# Patient Record
Sex: Female | Born: 1985 | Race: Black or African American | Hispanic: No | Marital: Single | Smoking: Never smoker
Health system: Southern US, Community
[De-identification: ages and names within clinical notes are randomized; demographics above are authoritative.]

## PROBLEM LIST (undated history)

## (undated) DIAGNOSIS — K429 Umbilical hernia without obstruction or gangrene: Secondary | ICD-10-CM

## (undated) DIAGNOSIS — E278 Other specified disorders of adrenal gland: Secondary | ICD-10-CM

## (undated) DIAGNOSIS — E079 Disorder of thyroid, unspecified: Secondary | ICD-10-CM

## (undated) DIAGNOSIS — E279 Disorder of adrenal gland, unspecified: Secondary | ICD-10-CM

## (undated) DIAGNOSIS — D497 Neoplasm of unspecified behavior of endocrine glands and other parts of nervous system: Secondary | ICD-10-CM

## (undated) DIAGNOSIS — C73 Malignant neoplasm of thyroid gland: Secondary | ICD-10-CM

## (undated) DIAGNOSIS — E0500 Thyrotoxicosis with diffuse goiter without thyrotoxic crisis or storm: Secondary | ICD-10-CM

## (undated) DIAGNOSIS — D649 Anemia, unspecified: Secondary | ICD-10-CM

## (undated) DIAGNOSIS — E039 Hypothyroidism, unspecified: Secondary | ICD-10-CM

## (undated) DIAGNOSIS — C801 Malignant (primary) neoplasm, unspecified: Secondary | ICD-10-CM

## (undated) HISTORY — DX: Neoplasm of unspecified behavior of endocrine glands and other parts of nervous system: D49.7

## (undated) HISTORY — DX: Disorder of thyroid, unspecified: E07.9

## (undated) HISTORY — DX: Malignant (primary) neoplasm, unspecified: C80.1

## (undated) HISTORY — PX: OTHER SURGICAL HISTORY: SHX169

## (undated) HISTORY — DX: Malignant neoplasm of thyroid gland: C73

## (undated) HISTORY — DX: Thyrotoxicosis with diffuse goiter without thyrotoxic crisis or storm: E05.00

## (undated) HISTORY — DX: Anemia, unspecified: D64.9

## (undated) MED FILL — Iron Sucrose Inj 20 MG/ML (Fe Equiv): INTRAVENOUS | Qty: 10 | Status: AC

---
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
## 2007-01-21 DIAGNOSIS — E079 Disorder of thyroid, unspecified: Secondary | ICD-10-CM | POA: Insufficient documentation

## 2007-01-21 HISTORY — DX: Disorder of thyroid, unspecified: E07.9

## 2010-02-16 ENCOUNTER — Emergency Department: Payer: Self-pay | Admitting: Emergency Medicine

## 2010-03-18 ENCOUNTER — Emergency Department: Payer: Self-pay | Admitting: Emergency Medicine

## 2011-03-06 DIAGNOSIS — D509 Iron deficiency anemia, unspecified: Secondary | ICD-10-CM | POA: Insufficient documentation

## 2011-05-07 DIAGNOSIS — O1490 Unspecified pre-eclampsia, unspecified trimester: Secondary | ICD-10-CM

## 2011-07-08 DIAGNOSIS — Z9889 Other specified postprocedural states: Secondary | ICD-10-CM | POA: Insufficient documentation

## 2011-07-08 DIAGNOSIS — O34219 Maternal care for unspecified type scar from previous cesarean delivery: Secondary | ICD-10-CM | POA: Insufficient documentation

## 2011-07-09 DIAGNOSIS — D62 Acute posthemorrhagic anemia: Secondary | ICD-10-CM | POA: Insufficient documentation

## 2011-07-17 ENCOUNTER — Emergency Department: Payer: Self-pay | Admitting: Emergency Medicine

## 2011-07-17 LAB — COMPREHENSIVE METABOLIC PANEL
Albumin: 2.9 g/dL — ABNORMAL LOW (ref 3.4–5.0)
Anion Gap: 10 (ref 7–16)
Bilirubin,Total: 0.3 mg/dL (ref 0.2–1.0)
Calcium, Total: 9.3 mg/dL (ref 8.5–10.1)
Chloride: 106 mmol/L (ref 98–107)
Co2: 21 mmol/L (ref 21–32)
EGFR (Non-African Amer.): >60
Glucose: 85 mg/dL (ref 65–99)
Osmolality: 273 (ref 275–301)
Potassium: 4.1 mmol/L (ref 3.5–5.1)

## 2011-07-17 LAB — URINALYSIS, COMPLETE
Bacteria: NONE SEEN
Bilirubin,UR: NEGATIVE
Ketone: NEGATIVE
Nitrite: NEGATIVE
Ph: 7 (ref 4.5–8.0)
Protein: 100
Specific Gravity: 1.017 (ref 1.003–1.030)
WBC UR: 18 /HPF (ref 0–5)

## 2011-07-17 LAB — PRO B NATRIURETIC PEPTIDE: B-Type Natriuretic Peptide: 42 pg/mL (ref 0–125)

## 2011-07-17 LAB — CBC
MCH: 25 pg — ABNORMAL LOW (ref 26.0–34.0)
Platelet: 576 10*3/uL — ABNORMAL HIGH (ref 150–440)
RBC: 3.58 10*6/uL — ABNORMAL LOW (ref 3.80–5.20)
WBC: 9.3 10*3/uL (ref 3.6–11.0)

## 2011-07-17 LAB — TSH: Thyroid Stimulating Horm: 66.6 u[IU]/mL — ABNORMAL HIGH

## 2011-07-17 LAB — T4, FREE: Free Thyroxine: 0.62 ng/dL — ABNORMAL LOW (ref 0.76–1.46)

## 2020-07-12 ENCOUNTER — Encounter: Payer: Self-pay | Admitting: Family Medicine

## 2020-07-12 ENCOUNTER — Other Ambulatory Visit: Payer: Self-pay

## 2020-07-12 ENCOUNTER — Ambulatory Visit (INDEPENDENT_AMBULATORY_CARE_PROVIDER_SITE_OTHER): Payer: Medicare Other | Admitting: Family Medicine

## 2020-07-12 VITALS — BP 132/90 | HR 80 | Ht 64.0 in | Wt 198.0 lb

## 2020-07-12 DIAGNOSIS — Z7689 Persons encountering health services in other specified circumstances: Secondary | ICD-10-CM

## 2020-07-12 DIAGNOSIS — K439 Ventral hernia without obstruction or gangrene: Secondary | ICD-10-CM

## 2020-07-12 DIAGNOSIS — E039 Hypothyroidism, unspecified: Secondary | ICD-10-CM | POA: Diagnosis not present

## 2020-07-12 DIAGNOSIS — Z862 Personal history of diseases of the blood and blood-forming organs and certain disorders involving the immune mechanism: Secondary | ICD-10-CM

## 2020-07-12 DIAGNOSIS — Z01419 Encounter for gynecological examination (general) (routine) without abnormal findings: Secondary | ICD-10-CM

## 2020-07-12 DIAGNOSIS — Z8639 Personal history of other endocrine, nutritional and metabolic disease: Secondary | ICD-10-CM | POA: Diagnosis not present

## 2020-07-12 DIAGNOSIS — D649 Anemia, unspecified: Secondary | ICD-10-CM | POA: Diagnosis not present

## 2020-07-12 NOTE — Progress Notes (Signed)
Date:  07/12/2020   Name:  Desiree Gaines   DOB:  03/06/1985   MRN:  [id number]   Chief Complaint: Establish Care Elnoria Howard Yolonda Kida is in room with pt to help with visit- pt has gave verification for her to be in room)  Patient is a 35 year old female who presents for a establish care exam. The patient reports the following problems: ventral/umbillical hernia. Health maintenance has been reviewed gyn care.   Lab Results  Component Value Date   CREATININE 0.61 07/17/2011   BUN 13 07/17/2011   NA 137 07/17/2011   K 4.1 07/17/2011   CL 106 07/17/2011   CO2 21 07/17/2011   No results found for: CHOL, HDL, LDLCALC, LDLDIRECT, TRIG, CHOLHDL Lab Results  Component Value Date   TSH 66.6 (H) 07/17/2011   No results found for: HGBA1C Lab Results  Component Value Date   WBC 9.3 07/17/2011   HGB 9.0 (L) 07/17/2011   HCT 29.8 (L) 07/17/2011   MCV 83 07/17/2011   PLT 576 (H) 07/17/2011   Lab Results  Component Value Date   ALT 13 07/17/2011   AST 15 07/17/2011   ALKPHOS 167 (H) 07/17/2011   BILITOT 0.3 07/17/2011     Review of Systems  Constitutional: Negative for chills and fever.  HENT: Negative for drooling, ear discharge, ear pain and sore throat.   Respiratory: Negative for cough, shortness of breath and wheezing.   Cardiovascular: Negative for chest pain, palpitations and leg swelling.  Gastrointestinal: Negative for abdominal pain, blood in stool, constipation, diarrhea and nausea.  Endocrine: Negative for polydipsia.  Genitourinary: Negative for dysuria, frequency, hematuria and urgency.  Musculoskeletal: Negative for back pain, myalgias and neck pain.  Skin: Negative for rash.  Allergic/Immunologic: Negative for environmental allergies.  Neurological: Negative for dizziness and headaches.  Hematological: Does not bruise/bleed easily.  Psychiatric/Behavioral: Negative for suicidal ideas. The patient is not nervous/anxious.     There are no problems to display  for this patient.   No Known Allergies  Past Surgical History:  Procedure Laterality Date  . CESAREAN SECTION     x 2  . tubes in ears Bilateral     Social History   Tobacco Use  . Smoking status: Never Smoker  . Smokeless tobacco: Never Used  Substance Use Topics  . Alcohol use: Not Currently  . Drug use: Never     Medication list has been reviewed and updated.  No outpatient medications have been marked as taking for the 07/12/20 encounter (Office Visit) with Juline Patch, MD.    [facility] 2/9 Scores 07/12/2020  PHQ - 2 Score 4  PHQ- 9 Score 12    GAD 7 : Generalized Anxiety Score 07/12/2020  Nervous, Anxious, on Edge 3  Control/stop worrying 2  Worry too much - different things 2  Trouble relaxing 1  Restless 1  Easily annoyed or irritable 1  Afraid - awful might happen 1  Total GAD 7 Score 11  Anxiety Difficulty Not difficult at all    BP Readings from Last 3 Encounters:  07/12/20 132/90    Physical Exam Vitals and nursing note reviewed.  Constitutional:      Appearance: She is well-developed.  HENT:     Head: Normocephalic.     Right Ear: External ear normal.     Left Ear: External ear normal.     Nose: Nose normal. No congestion or rhinorrhea.  Eyes:     General: Lids are  everted, no foreign bodies appreciated. No scleral icterus.       Left eye: No foreign body or hordeolum.     Conjunctiva/sclera: Conjunctivae normal.     Right eye: Right conjunctiva is not injected.     Left eye: Left conjunctiva is not injected.     Pupils: Pupils are equal, round, and reactive to light.  Neck:     Thyroid: No thyromegaly.     Vascular: No JVD.     Trachea: No tracheal deviation.  Cardiovascular:     Rate and Rhythm: Normal rate and regular rhythm.     Heart sounds: Normal heart sounds. No murmur heard. No friction rub. No gallop.   Pulmonary:     Effort: Pulmonary effort is normal. No respiratory distress.     Breath sounds: Normal breath sounds. No  wheezing, rhonchi or rales.  Abdominal:     General: Bowel sounds are normal.     Palpations: Abdomen is soft. There is no mass.     Tenderness: There is no abdominal tenderness. There is no guarding or rebound.     Hernia: A hernia is present. Hernia is present in the umbilical area and ventral area.  Musculoskeletal:        General: No tenderness. Normal range of motion.     Cervical back: Normal range of motion and neck supple.  Lymphadenopathy:     Cervical: No cervical adenopathy.  Skin:    General: Skin is warm.     Findings: No rash.  Neurological:     Mental Status: She is alert and oriented to person, place, and time.     Cranial Nerves: No cranial nerve deficit.     Deep Tendon Reflexes: Reflexes normal.  Psychiatric:        Mood and Affect: Mood is not anxious or depressed.     Wt Readings from Last 3 Encounters:  07/12/20 198 lb (89.8 kg)    BP 132/90   Pulse 80   Ht 5\' 4"  (1.626 m)   Wt 198 lb (89.8 kg)   LMP 06/20/2020 Comment: irregular  BMI 33.99 kg/m   Assessment and Plan:  1. Establishing care with new doctor, encounter for Patient establishing care with new physician.  We are picking from 2015 when she was evaluated with a ventral hernia and which time she did not go back for lab work and preop evaluation and ultimately labs and CT scans to take a look at the abdominal structures prior to surgery. - Comprehensive metabolic panel  2. Encounter for routine gynecologic examination in Medicare patient Initially [place] start with referral to GYN for Pap and pelvic and evaluation of uterus adnexa and ovaries prior to surgery. - Comprehensive metabolic panel - Ambulatory referral to Gynecology  3. Ventral hernia without obstruction or gangrene Significant ventral hernia which has probably increased but has no gangrene or obstruction.  It is reducible but there is some discomfort when reduced reducing the umbilical portion of it.  4. History of Graves'  disease Upon review of sepsis numbers with patient patient had a history of Graves' disease for which on 2 occasions she had to consume iodine with radioactivity and there was destruction of her thyroid.  Patient had acquired hypothyroid and was in the process of being evaluated with endocrine when patient did not return for further evaluation we will obtain a thyroid panel with TSH to determine her level of hypothyroidism. - Thyroid Panel With TSH  5. Acquired hypothyroidism This is  acquired by above and we will check thyroid panel and treat accordingly with levothyroxine. - Thyroid Panel With TSH  6. History of anemia Was noted on CBC the patient had anemia and we are rechecking a CBC along with CMP to see if there is indeed continued anemia and will correct this accordingly. - CBC with Differential/Platelet - Comprehensive metabolic panel  If all goes as planned we will be contacting initially we thought general surgery at [facility] but I we understand there is a hernia clinic and that patient will need to go in this direction given the significance of the ventral hernia.  We will try to clear up things from of the medical standpoint and refer to hernia clinic for evaluation with CT scan and further determination of corrective measures.  Patient has a adult accompanying her that used to give her help and to make sure she is able to to follow through with this direction her name is Karn Pickler and phone number is been placed when they call her for GYN appointment upcoming.

## 2020-07-13 ENCOUNTER — Other Ambulatory Visit: Payer: Self-pay

## 2020-07-13 DIAGNOSIS — E039 Hypothyroidism, unspecified: Secondary | ICD-10-CM

## 2020-07-13 LAB — CBC WITH DIFFERENTIAL/PLATELET
Basophils Absolute: 0 10*3/uL (ref 0.0–0.2)
Basos: 1 %
EOS (ABSOLUTE): 0 10*3/uL (ref 0.0–0.4)
Eos: 1 %
Hematocrit: 30.1 % — ABNORMAL LOW (ref 34.0–46.6)
Hemoglobin: 9.6 g/dL — ABNORMAL LOW (ref 11.1–15.9)
Immature Grans (Abs): 0.1 10*3/uL (ref 0.0–0.1)
Immature Granulocytes: 2 %
Lymphocytes Absolute: 1.9 10*3/uL (ref 0.7–3.1)
Lymphs: 31 %
MCH: 31.8 pg (ref 26.6–33.0)
MCHC: 31.9 g/dL (ref 31.5–35.7)
MCV: 100 fL — ABNORMAL HIGH (ref 79–97)
Monocytes Absolute: 0.4 10*3/uL (ref 0.1–0.9)
Monocytes: 7 %
Neutrophils Absolute: 3.6 10*3/uL (ref 1.4–7.0)
Neutrophils: 58 %
Platelets: 242 10*3/uL (ref 150–450)
RBC: 3.02 x10E6/uL — ABNORMAL LOW (ref 3.77–5.28)
RDW: 13.1 % (ref 11.7–15.4)
WBC: 6.1 10*3/uL (ref 3.4–10.8)

## 2020-07-13 LAB — COMPREHENSIVE METABOLIC PANEL
ALT: 20 IU/L (ref 0–32)
AST: 34 IU/L (ref 0–40)
Albumin/Globulin Ratio: 1.4 (ref 1.2–2.2)
Albumin: 4.6 g/dL (ref 3.8–4.8)
Alkaline Phosphatase: 101 IU/L (ref 44–121)
BUN/Creatinine Ratio: 13 (ref 9–23)
BUN: 13 mg/dL (ref 6–20)
Bilirubin Total: 0.4 mg/dL (ref 0.0–1.2)
CO2: 22 mmol/L (ref 20–29)
Calcium: 10.6 mg/dL — ABNORMAL HIGH (ref 8.7–10.2)
Chloride: 102 mmol/L (ref 96–106)
Creatinine, Ser: 1 mg/dL (ref 0.57–1.00)
Globulin, Total: 3.2 g/dL (ref 1.5–4.5)
Glucose: 90 mg/dL (ref 65–99)
Potassium: 4.9 mmol/L (ref 3.5–5.2)
Sodium: 136 mmol/L (ref 134–144)
Total Protein: 7.8 g/dL (ref 6.0–8.5)
eGFR: 76 mL/min/{1.73_m2} (ref >59)

## 2020-07-13 LAB — THYROID PANEL WITH TSH
Free Thyroxine Index: 0 — ABNORMAL LOW (ref 1.2–4.9)
T3 Uptake Ratio: 9 % — ABNORMAL LOW (ref 24–39)
T4, Total: 0.5 ug/dL — CL (ref 4.5–12.0)
TSH: 66.4 u[IU]/mL — ABNORMAL HIGH (ref 0.450–4.500)

## 2020-07-13 MED ORDER — LEVOTHYROXINE SODIUM 50 MCG PO TABS: 50.0000 ug | ORAL_TABLET | Freq: Every day | ORAL | 1 refills | Status: DC

## 2020-07-13 NOTE — Progress Notes (Signed)
Sent in levo 30mcg

## 2020-07-16 ENCOUNTER — Telehealth: Payer: Self-pay | Admitting: Family Medicine

## 2020-07-16 ENCOUNTER — Telehealth: Payer: Self-pay

## 2020-07-16 ENCOUNTER — Other Ambulatory Visit: Payer: Self-pay

## 2020-07-16 DIAGNOSIS — K439 Ventral hernia without obstruction or gangrene: Secondary | ICD-10-CM

## 2020-07-16 LAB — FERRITIN: Ferritin: 29 ng/mL (ref 15–150)

## 2020-07-16 LAB — SPECIMEN STATUS REPORT

## 2020-07-16 LAB — B12 AND FOLATE PANEL
Folate: 3.6 ng/mL (ref >3.0)
Vitamin B-12: 355 pg/mL (ref 232–1245)

## 2020-07-16 NOTE — Telephone Encounter (Signed)
Called to request for information for referral that was received for patient.  The referral was for CT of abdomen and pelvis w/contrast.  Stated that a prior auth. Is needed to be approved.  Please call [phone] to discuss.  And fax# [phone]

## 2020-07-16 NOTE — Telephone Encounter (Signed)
Called Lauren and we will forward referral to [facility] Surg center and go from there

## 2020-07-16 NOTE — Progress Notes (Signed)
Faxed order to [facility] for CT of Abd and pelvis without contrast per hernia clinic to [phone]

## 2020-07-19 ENCOUNTER — Encounter: Payer: Medicare Other | Admitting: Family Medicine

## 2020-07-27 ENCOUNTER — Ambulatory Visit: Payer: Medicare Other | Admitting: Surgery

## 2020-07-27 ENCOUNTER — Ambulatory Visit: Payer: Medicare Other | Admitting: Family Medicine

## 2020-07-28 ENCOUNTER — Ambulatory Visit: Payer: Medicare Other | Admitting: Family Medicine

## 2020-07-28 ENCOUNTER — Telehealth: Payer: Self-pay

## 2020-07-28 ENCOUNTER — Encounter: Payer: Self-pay | Admitting: Obstetrics and Gynecology

## 2020-07-28 NOTE — Telephone Encounter (Signed)
[place] [facility] referring for routine care. Called and left voicemail for patient to call back to be scheduled.

## 2020-07-29 NOTE — Telephone Encounter (Signed)
Voicemail box is full - unable to leave message

## 2020-07-30 NOTE — Telephone Encounter (Signed)
Voicemail box is full - unable to leave message

## 2020-08-02 ENCOUNTER — Emergency Department: Payer: Medicare Other

## 2020-08-02 ENCOUNTER — Inpatient Hospital Stay
Admission: EM | Admit: 2020-08-02 | Discharge: 2020-08-07 | DRG: 158 | Disposition: A | Discharge status: Home / Self Care | Payer: Medicare Other | Attending: Family Medicine | Admitting: Family Medicine

## 2020-08-02 ENCOUNTER — Other Ambulatory Visit: Payer: Self-pay

## 2020-08-02 ENCOUNTER — Encounter: Payer: Self-pay | Admitting: Emergency Medicine

## 2020-08-02 DIAGNOSIS — M609 Myositis, unspecified: Secondary | ICD-10-CM | POA: Diagnosis not present

## 2020-08-02 DIAGNOSIS — Z79890 Hormone replacement therapy (postmenopausal): Secondary | ICD-10-CM

## 2020-08-02 DIAGNOSIS — K1120 Sialoadenitis, unspecified: Secondary | ICD-10-CM | POA: Diagnosis not present

## 2020-08-02 DIAGNOSIS — Z20822 Contact with and (suspected) exposure to covid-19: Secondary | ICD-10-CM | POA: Diagnosis not present

## 2020-08-02 DIAGNOSIS — E669 Obesity, unspecified: Secondary | ICD-10-CM | POA: Diagnosis present

## 2020-08-02 DIAGNOSIS — E538 Deficiency of other specified B group vitamins: Secondary | ICD-10-CM | POA: Diagnosis not present

## 2020-08-02 DIAGNOSIS — T8130XA Disruption of wound, unspecified, initial encounter: Secondary | ICD-10-CM | POA: Diagnosis not present

## 2020-08-02 DIAGNOSIS — R6889 Other general symptoms and signs: Secondary | ICD-10-CM | POA: Diagnosis not present

## 2020-08-02 DIAGNOSIS — E039 Hypothyroidism, unspecified: Secondary | ICD-10-CM | POA: Diagnosis present

## 2020-08-02 DIAGNOSIS — E0500 Thyrotoxicosis with diffuse goiter without thyrotoxic crisis or storm: Secondary | ICD-10-CM | POA: Diagnosis not present

## 2020-08-02 DIAGNOSIS — L03211 Cellulitis of face: Secondary | ICD-10-CM | POA: Diagnosis present

## 2020-08-02 DIAGNOSIS — R609 Edema, unspecified: Secondary | ICD-10-CM

## 2020-08-02 DIAGNOSIS — Z6833 Body mass index (BMI) 33.0-33.9, adult: Secondary | ICD-10-CM | POA: Diagnosis not present

## 2020-08-02 DIAGNOSIS — R600 Localized edema: Secondary | ICD-10-CM | POA: Diagnosis not present

## 2020-08-02 DIAGNOSIS — R52 Pain, unspecified: Secondary | ICD-10-CM | POA: Diagnosis not present

## 2020-08-02 DIAGNOSIS — D649 Anemia, unspecified: Secondary | ICD-10-CM | POA: Diagnosis present

## 2020-08-02 DIAGNOSIS — L03221 Cellulitis of neck: Secondary | ICD-10-CM | POA: Diagnosis not present

## 2020-08-02 DIAGNOSIS — K047 Periapical abscess without sinus: Principal | ICD-10-CM | POA: Diagnosis present

## 2020-08-02 DIAGNOSIS — K429 Umbilical hernia without obstruction or gangrene: Secondary | ICD-10-CM | POA: Diagnosis not present

## 2020-08-02 DIAGNOSIS — J392 Other diseases of pharynx: Secondary | ICD-10-CM | POA: Diagnosis not present

## 2020-08-02 DIAGNOSIS — Z743 Need for continuous supervision: Secondary | ICD-10-CM | POA: Diagnosis not present

## 2020-08-02 DIAGNOSIS — Z8719 Personal history of other diseases of the digestive system: Secondary | ICD-10-CM | POA: Diagnosis not present

## 2020-08-02 HISTORY — DX: Umbilical hernia without obstruction or gangrene: K42.9

## 2020-08-02 LAB — BASIC METABOLIC PANEL
Anion gap: 5 (ref 5–15)
BUN: 8 mg/dL (ref 6–20)
CO2: 28 mmol/L (ref 22–32)
Calcium: 9.8 mg/dL (ref 8.9–10.3)
Chloride: 99 mmol/L (ref 98–111)
Creatinine, Ser: 0.76 mg/dL (ref 0.44–1.00)
GFR, Estimated: >60 mL/min (ref >60)
Glucose, Bld: 101 mg/dL — ABNORMAL HIGH (ref 70–99)
Potassium: 3.8 mmol/L (ref 3.5–5.1)
Sodium: 132 mmol/L — ABNORMAL LOW (ref 135–145)

## 2020-08-02 LAB — CBC WITH DIFFERENTIAL/PLATELET
Abs Immature Granulocytes: 0.88 10*3/uL — ABNORMAL HIGH (ref 0.00–0.07)
Basophils Absolute: 0.1 10*3/uL (ref 0.0–0.1)
Basophils Relative: 0 %
Eosinophils Absolute: 0 10*3/uL (ref 0.0–0.5)
Eosinophils Relative: 0 %
HCT: 27.6 % — ABNORMAL LOW (ref 36.0–46.0)
Hemoglobin: 8.7 g/dL — ABNORMAL LOW (ref 12.0–15.0)
Immature Granulocytes: 5 %
Lymphocytes Relative: 4 %
Lymphs Abs: 0.7 10*3/uL (ref 0.7–4.0)
MCH: 31.1 pg (ref 26.0–34.0)
MCHC: 31.5 g/dL (ref 30.0–36.0)
MCV: 98.6 fL (ref 80.0–100.0)
Monocytes Absolute: 0.6 10*3/uL (ref 0.1–1.0)
Monocytes Relative: 3 %
Neutro Abs: 16.2 10*3/uL — ABNORMAL HIGH (ref 1.7–7.7)
Neutrophils Relative %: 88 %
Platelets: 372 10*3/uL (ref 150–400)
RBC: 2.8 MIL/uL — ABNORMAL LOW (ref 3.87–5.11)
RDW: 14.2 % (ref 11.5–15.5)
WBC: 18.6 10*3/uL — ABNORMAL HIGH (ref 4.0–10.5)
nRBC: 0.1 % (ref 0.0–0.2)

## 2020-08-02 LAB — HCG, QUANTITATIVE, PREGNANCY: hCG, Beta Chain, Quant, S: 1 m[IU]/mL (ref <5)

## 2020-08-02 LAB — RESP PANEL BY RT-PCR (FLU A&B, COVID) ARPGX2
Influenza A by PCR: NEGATIVE
Influenza B by PCR: NEGATIVE
SARS Coronavirus 2 by RT PCR: NEGATIVE

## 2020-08-02 LAB — HIV ANTIBODY (ROUTINE TESTING W REFLEX): HIV Screen 4th Generation wRfx: NONREACTIVE

## 2020-08-02 MED ORDER — ACETAMINOPHEN 650 MG RE SUPP
650.0000 mg | Freq: Four times a day (QID) | RECTAL | Status: DC | PRN
  Administered 2020-08-02: 650 mg via RECTAL
  Filled 2020-08-02: qty 1

## 2020-08-02 MED ORDER — DEXAMETHASONE SODIUM PHOSPHATE 10 MG/ML IJ SOLN
10.0000 mg | Freq: Once | INTRAMUSCULAR | Status: AC
  Administered 2020-08-02: 10 mg via INTRAVENOUS
  Filled 2020-08-02: qty 1

## 2020-08-02 MED ORDER — SODIUM CHLORIDE 0.9 % IV SOLN
3.0000 g | Freq: Once | INTRAVENOUS | Status: AC
  Administered 2020-08-02: 3 g via INTRAVENOUS
  Filled 2020-08-02: qty 8

## 2020-08-02 MED ORDER — ACETAMINOPHEN 160 MG/5ML PO SOLN
650.0000 mg | Freq: Four times a day (QID) | ORAL | Status: DC | PRN
  Administered 2020-08-03 – 2020-08-06 (×4): 650 mg via ORAL
  Filled 2020-08-02 (×7): qty 20.3

## 2020-08-02 MED ORDER — ACETAMINOPHEN 325 MG PO TABS: 650.0000 mg | ORAL_TABLET | Freq: Four times a day (QID) | ORAL | Status: DC | PRN

## 2020-08-02 MED ORDER — ENOXAPARIN SODIUM 40 MG/0.4ML IJ SOSY
40.0000 mg | PREFILLED_SYRINGE | INTRAMUSCULAR | Status: DC
  Administered 2020-08-02 – 2020-08-06 (×5): 40 mg via SUBCUTANEOUS
  Filled 2020-08-02 (×5): qty 0.4

## 2020-08-02 MED ORDER — SODIUM CHLORIDE 0.9 % IV SOLN: INTRAVENOUS | Status: DC

## 2020-08-02 MED ORDER — DEXAMETHASONE SODIUM PHOSPHATE 4 MG/ML IJ SOLN
4.0000 mg | Freq: Four times a day (QID) | INTRAMUSCULAR | Status: DC
  Administered 2020-08-02 – 2020-08-05 (×14): 4 mg via INTRAVENOUS
  Filled 2020-08-02 (×18): qty 1

## 2020-08-02 MED ORDER — ONDANSETRON HCL 4 MG PO TABS: 4.0000 mg | ORAL_TABLET | Freq: Four times a day (QID) | ORAL | Status: DC | PRN

## 2020-08-02 MED ORDER — MORPHINE SULFATE (PF) 4 MG/ML IV SOLN
4.0000 mg | Freq: Once | INTRAVENOUS | Status: AC
  Administered 2020-08-02: 4 mg via INTRAVENOUS
  Filled 2020-08-02: qty 1

## 2020-08-02 MED ORDER — IOHEXOL 300 MG/ML  SOLN
75.0000 mL | Freq: Once | INTRAMUSCULAR | Status: AC | PRN
  Administered 2020-08-02: 75 mL via INTRAVENOUS

## 2020-08-02 MED ORDER — ONDANSETRON HCL 4 MG/2ML IJ SOLN: 4.0000 mg | Freq: Four times a day (QID) | INTRAMUSCULAR | Status: DC | PRN

## 2020-08-02 NOTE — ED Notes (Signed)
Update given to patient's sister Pam at this time.

## 2020-08-02 NOTE — ED Notes (Signed)
Resumed care from [place] rn.  Pt sleeping

## 2020-08-02 NOTE — Telephone Encounter (Signed)
Voice mall is full unable to leave message. Contacting referring for provider. Multiple attempts to reach patient have been unsuccessful.

## 2020-08-02 NOTE — ED Notes (Signed)
Pt awake and watching tv.  No resp distress.

## 2020-08-02 NOTE — ED Notes (Signed)
Report messaged to [place] rn floor nurse

## 2020-08-02 NOTE — H&P (Signed)
History and Physical    Desiree Gaines YNW:[id number] DOB: [birth year] DOA: 08/02/2020  PCP: Juline Patch, MD   Patient coming from: Home  I have personally briefly reviewed patient's old medical records in [place]  Chief Complaint: Right facial swelling Patient has cognitive disability most of the history was obtained from her and family member at the bedside.  HPI: Desiree Gaines is a 35 y.o. female with medical history significant for Graves' disease, cognitive disability, umbilical hernia who presents to the emergency room via EMS for evaluation of swelling involving the right side of her face.  Patient has had symptoms for 3 days and has an abscessed tooth on the right side which has been draining pus.  She has significant swelling involving the right side of her face and extending to the upper chest wall. She has had chills but denies having any fever.  She has not had any drooling and has pain with swallowing.  She denies having any shortness of breath. She denies having any chest pain, no dizziness, no lightheadedness, no headache, no abdominal pain, no changes in her bowel habits, no urinary symptoms, no palpitations, no diaphoresis, no leg swelling. Labs show sodium 132, potassium 3.8, chloride 99, bicarb 28, glucose 101, BUN 8, creatinine 0.76, calcium 9.8, white count 18.6, hemoglobin 8.7, hematocrit 27.6, MCV 98.6, RDW 14.2, platelet count 372  Respiratory viral panel is negative Soft tissue neck CT shows odontogenic infection related to the lower right terminal molar with advanced multi spatial inflammation reaching the upper chest, displacing the tongue, and causing submucosal edema in the right hypopharynx and supraglottic larynx. No abscess.    ED Course: Patient is a 35 year old female with mild cognitive disability who presents to the ER via EMS for evaluation of swelling involving the right side of her face and pus draining from an abscessed tooth on  the right side. Soft tissue CT shows extensive swelling involving the upper chest, displacing the tongue and causing submucosal edema in the right hypopharynx and supraglottic larynx.  No abscess Patient received IV Decadron and IV Unasyn.  Review of Systems: As per HPI otherwise all other systems reviewed and negative.    Past Medical History:  Diagnosis Date   Anemia    Graves disease    Pituitary tumor    Thyroid disease    Umbilical hernia     Past Surgical History:  Procedure Laterality Date   CESAREAN SECTION     x 2   tubes in ears Bilateral      reports that she has never smoked. She has never used smokeless tobacco. She reports previous alcohol use. She reports that she does not use drugs.  No Known Allergies  Family History  Problem Relation Age of Onset   Diabetes Mother    Hypertension Mother    Stroke Mother    Cancer Sister    Hypertension Sister    Heart disease Brother       Prior to Admission medications   Medication Sig Start Date End Date Taking? Authorizing Provider  levothyroxine (SYNTHROID) 50 MCG tablet Take 1 tablet (50 mcg total) by mouth daily. 07/13/20  Yes Juline Patch, MD    Physical Exam: Vitals:   08/02/20 0431 08/02/20 0530 08/02/20 0648  BP: (!) 144/100  (!) 139/97  Pulse: 97 (!) 101 (!) 117  Resp: 20  18  Temp: 99.4 F (37.4 C)    TempSrc: Oral    SpO2: 99% 99% 98%  Weight: 89.8 kg    Height: 5\' 4"  (1.626 m)       Vitals:   08/02/20 0431 08/02/20 0530 08/02/20 0648  BP: (!) 144/100  (!) 139/97  Pulse: 97 (!) 101 (!) 117  Resp: 20  18  Temp: 99.4 F (37.4 C)    TempSrc: Oral    SpO2: 99% 99% 98%  Weight: 89.8 kg    Height: 5\' 4"  (1.626 m)        Constitutional: Alert and oriented x 3 . Not in any apparent distress HEENT:      Head: Normocephalic and atraumatic. Right sided facial swelling       Eyes: PERLA, EOMI, Conjunctivae are normal. Sclera is non-icteric.       Mouth/Throat: Mucous membranes are moist.        Neck: Supple with no signs of meningismus. Cardiovascular: Regular rate and rhythm. No murmurs, gallops, or rubs. 2+ symmetrical distal pulses are present . No JVD. No LE edema Respiratory: Respiratory effort normal .Lungs sounds clear bilaterally. No wheezes, crackles, or rhonchi.  Gastrointestinal: Soft, non tender, and non distended with positive bowel sounds. Ventral hernia Genitourinary: No CVA tenderness. Musculoskeletal: Nontender with normal range of motion in all extremities. No cyanosis, or erythema of extremities. Neurologic:  Face is symmetric. Moving all extremities. No gross focal neurologic deficits . Skin: Skin is warm, dry.  No rash or ulcers Psychiatric: Mood and affect are normal    Labs on Admission: I have personally reviewed following labs and imaging studies  CBC: Recent Labs  Lab 08/02/20 0445  WBC 18.6*  NEUTROABS 16.2*  HGB 8.7*  HCT 27.6*  MCV 98.6  PLT 308   Basic Metabolic Panel: Recent Labs  Lab 08/02/20 0613  NA 132*  K 3.8  CL 99  CO2 28  GLUCOSE 101*  BUN 8  CREATININE 0.76  CALCIUM 9.8   GFR: Estimated Creatinine Clearance: 108.5 mL/min (by C-G formula based on SCr of 0.76 mg/dL). Liver Function Tests: No results for input(s): AST, ALT, ALKPHOS, BILITOT, PROT, ALBUMIN in the last 168 hours. No results for input(s): LIPASE, AMYLASE in the last 168 hours. No results for input(s): AMMONIA in the last 168 hours. Coagulation Profile: No results for input(s): INR, PROTIME in the last 168 hours. Cardiac Enzymes: No results for input(s): CKTOTAL, CKMB, CKMBINDEX, TROPONINI in the last 168 hours. BNP (last 3 results) No results for input(s): PROBNP in the last 8760 hours. HbA1C: No results for input(s): HGBA1C in the last 72 hours. CBG: No results for input(s): GLUCAP in the last 168 hours. Lipid Profile: No results for input(s): CHOL, HDL, LDLCALC, TRIG, CHOLHDL, LDLDIRECT in the last 72 hours. Thyroid Function Tests: No results  for input(s): TSH, T4TOTAL, FREET4, T3FREE, THYROIDAB in the last 72 hours. Anemia Panel: No results for input(s): VITAMINB12, FOLATE, FERRITIN, TIBC, IRON, RETICCTPCT in the last 72 hours. Urine analysis:    Component Value Date/Time   COLORURINE Yellow 07/17/2011 0725   APPEARANCEUR Hazy 07/17/2011 0725   LABSPEC 1.017 07/17/2011 0725   PHURINE 7.0 07/17/2011 0725   GLUCOSEU Negative 07/17/2011 0725   HGBUR 2+ 07/17/2011 0725   BILIRUBINUR Negative 07/17/2011 0725   KETONESUR Negative 07/17/2011 0725   PROTEINUR 100 mg/dL 07/17/2011 0725   NITRITE Negative 07/17/2011 0725   LEUKOCYTESUR Trace 07/17/2011 0725    Radiological Exams on Admission: CT SOFT TISSUE NECK W CONTRAST  Result Date: 08/02/2020 CLINICAL DATA:  Abscessed tooth with jaw swelling EXAM: CT NECK WITH CONTRAST  TECHNIQUE: Multidetector CT imaging of the neck was performed using the standard protocol following the bolus administration of intravenous contrast. CONTRAST:  40mL OMNIPAQUE IOHEXOL 300 MG/ML  SOLN COMPARISON:  None. FINDINGS: Pharynx and larynx: Secondary inflammation of fact Ng the right pharynx and supraglottic larynx with submucosal edema and mucosal thickening. Salivary glands: Secondary inflammation around the right parotid and submandibular glands Thyroid: Subjectively small gland. Lymph nodes: Expected adenitis on the right more than left, without cavitation. Vascular: Major vascular structures are patent. Extrinsic narrowing of the right IJ. Limited intracranial: Negative Visualized orbits: Negative Mastoids and visualized paranasal sinuses: Partial mastoid opacification on the right more than left Skeleton: The lower right terminal molar, with horizontal lie, is devitalized with medial alveolar dehiscence. Severe regional cellulitis affecting the masticator compartment, cheek, and right submandibular space. Upper chest: Soft tissue structure in the anterior mediastinum correlates is most likely thymus.  Subcutaneous stranding does continue into the subcutaneous upper chest in the midline. IMPRESSION: Odontogenic infection related to the lower right terminal molar with advanced multi spatial inflammation reaching the upper chest, displacing the tongue, and causing submucosal edema in the right hypopharynx and supraglottic larynx. No abscess. Electronically Signed   By: Monte Fantasia M.D.   On: 08/02/2020 06:27     Assessment/Plan Principal Problem:   Facial cellulitis Active Problems:   Hypothyroidism     Facial Cellulitis From an abscessed tooth Patient has significant swelling involving the right side of her face Will place patient on Decadron 4 mg IV every 6 over 24 hours Place patient on IV Unasyn    Hypothyroidism Hold Synthroid for now until swelling improves  DVT prophylaxis: Lovenox  Code Status: full code  Family Communication: Greater than 50% of time was spent discussing patient's condition and plan of care with her and her sister at the bedside.  All questions and concerns have been addressed.  They verbalized understanding and agree with the plan. Disposition Plan: Back to previous home environment Consults called: none  Status: At the time of admission, it appears that the appropriate admission status for this patient is inpatient. This is judged to be reasonable and necessary in order to provide the required intensity of service to ensure the patient's safety given the presenting symptoms, physical exam findings, and initial radiographic and laboratory data in the context of the comorbid conditions. Patient requires inpatient status due to high intensity of service, high risk for further deterioration and high frequency of surveillance required.    Ravneet Spilker MD [facility]     08/02/2020, 8:54 AM  Overall tPA 12 until patient

## 2020-08-02 NOTE — ED Triage Notes (Addendum)
First RN Note: Pt to ED via [facility] with c/o abscessed tooth and jaw swelling, per EMS pt reports has been this way x 2 days.   VSS   Pt with noted significant swelling that extends from R side of face under jaw. Pt states difficulty swallowing and difficulty breathing due to swelling.

## 2020-08-02 NOTE — ED Notes (Signed)
Informed RN bed assigned 

## 2020-08-02 NOTE — ED Notes (Signed)
Pt presents with right sided jaw swelling due to a dental abscess. Pt states that she is having "foul smelling" pus coming from her mouth. Denies SOB at this time. No additional concerns at this time.

## 2020-08-02 NOTE — ED Notes (Signed)
Pt sleeping  again 

## 2020-08-02 NOTE — Progress Notes (Signed)
Patient is requesting something for her headache. She has a hx of a brain tumor when she was a child and still gets headaches.  She uses liquid tylenol at home. She has suppository and tablets ordered here. (she does not want the suppository). The other issue is she is NPO due to the swelling in her neck/throat. Order received from Dr Sidney Ace to change tylenol to liquid and NPO except sips with meds

## 2020-08-02 NOTE — ED Provider Notes (Signed)
[facility] Emergency Department Provider Note   ____________________________________________   Event Date/Time   First MD Initiated Contact with Patient 08/02/20 [phone]     (approximate)  I have reviewed the triage vital signs and the nursing notes.   HISTORY  Chief Complaint Oral Swelling    HPI Desiree Gaines is a 35 y.o. female with past medical history of cognitive disability, Graves' disease, and anemia who presents to the ED for facial swelling.  History is limited due to patient's cognitive disability.  She states that she started to notice pain and swelling around a right lower molar 2 to 3 days ago.  She denies any fevers, but she has noticed pus draining from the area beside her painful molar.  It is painful for her to open her jaw or swallow.  Swelling seemed to significantly worsen overnight and patient was woken from sleep by difficulty breathing.  Breathing now feels better sitting up and she denies any difficulty swallowing her spit.        Past Medical History:  Diagnosis Date   Anemia    Graves disease    Pituitary tumor    Thyroid disease    Umbilical hernia     Patient Active Problem List   Diagnosis Date Noted   Facial cellulitis 08/02/2020    Past Surgical History:  Procedure Laterality Date   CESAREAN SECTION     x 2   tubes in ears Bilateral     Prior to Admission medications   Medication Sig Start Date End Date Taking? Authorizing Provider  levothyroxine (SYNTHROID) 50 MCG tablet Take 1 tablet (50 mcg total) by mouth daily. 07/13/20  Yes Juline Patch, MD    Allergies Patient has no known allergies.  Family History  Problem Relation Age of Onset   Diabetes Mother    Hypertension Mother    Stroke Mother    Cancer Sister    Hypertension Sister    Heart disease Brother     Social History Social History   Tobacco Use   Smoking status: Never   Smokeless tobacco: Never  Substance Use Topics    Alcohol use: Not Currently   Drug use: Never    Review of Systems  Constitutional: No fever/chills Eyes: No visual changes. ENT: Positive for dental infection and facial swelling. Cardiovascular: Denies chest pain. Respiratory: Denies shortness of breath. Gastrointestinal: No abdominal pain.  No nausea, no vomiting.  No diarrhea.  No constipation. Genitourinary: Negative for dysuria. Musculoskeletal: Negative for back pain. Skin: Negative for rash. Neurological: Negative for headaches, focal weakness or numbness.  ____________________________________________   PHYSICAL EXAM:  VITAL SIGNS: ED Triage Vitals [08/02/20 0431]  Enc Vitals Group     BP (!) 144/100     Pulse Rate 97     Resp 20     Temp 99.4 F (37.4 C)     Temp Source Oral     SpO2 99 %     Weight 198 lb (89.8 kg)     Height 5\' 4"  (1.626 m)     Head Circumference      Peak Flow      Pain Score 7     Pain Loc      Pain Edu?      Excl. in [place]?     Constitutional: Alert and oriented. Eyes: Conjunctivae are normal. Head: Atraumatic. Nose: No congestion/rhinnorhea. Mouth/Throat: Edema and tenderness adjacent to right lower molar with associated purulent drainage.  Significant  edema over right lower face extending into submandibular space and along right neck.  Patient tolerating oral secretions without difficulty. Neck: Edema and tenderness extending along right neck. Cardiovascular: Tachycardic, regular rhythm. Grossly normal heart sounds. Respiratory: Normal respiratory effort.  No retractions. Lungs CTAB. Gastrointestinal: Soft and nontender. No distention.  Large ventral hernia noted. Genitourinary: deferred Musculoskeletal: No lower extremity tenderness nor edema. Neurologic:  Normal speech and language. No gross focal neurologic deficits are appreciated. Skin:  Skin is warm, dry and intact. No rash noted. Psychiatric: Mood and affect are normal. Speech and behavior are  normal.  ____________________________________________   LABS (all labs ordered are listed, but only abnormal results are displayed)  Labs Reviewed  CBC WITH DIFFERENTIAL/PLATELET - Abnormal; Notable for the following components:      Result Value   WBC 18.6 (*)    RBC 2.80 (*)    Hemoglobin 8.7 (*)    HCT 27.6 (*)    Neutro Abs 16.2 (*)    Abs Immature Granulocytes 0.88 (*)    All other components within normal limits  BASIC METABOLIC PANEL - Abnormal; Notable for the following components:   Sodium 132 (*)    Glucose, Bld 101 (*)    All other components within normal limits  RESP PANEL BY RT-PCR (FLU A&B, COVID) ARPGX2  HCG, QUANTITATIVE, PREGNANCY     PROCEDURES  Procedure(s) performed (including Critical Care):  Procedures   ____________________________________________   INITIAL IMPRESSION / ASSESSMENT AND PLAN / ED COURSE      36 year old female with past medical history of cognitive disability, Graves' disease status post thyroid ablation, and anemia who presents to the ED complaining of 2 to 3 days of right lower dental pain associated with facial swelling, significantly worsening overnight.  Patient has significant edema over her right lower face, submandibular space, and extending down the right side of her neck.  She is not in any respiratory distress and maintaining oral secretions without difficulty.  This was further assessed with CT scan and shows significant edema associated with dental infection, including around her tongue and larynx.  CT images discussed with Dr. Richardson Landry of ENT, who recommends treatment with antibiotics, Decadron, and admission to hospital.  No focal abscess noted to require operative intervention.  Case discussed with hospitalist for admission.      ____________________________________________   FINAL CLINICAL IMPRESSION(S) / ED DIAGNOSES  Final diagnoses:  Dental infection  Facial cellulitis     ED Discharge Orders     None         Note:  This document was prepared using Dragon voice recognition software and may include unintentional dictation errors.    Blake Divine, MD 08/02/20 [phone]

## 2020-08-03 LAB — BASIC METABOLIC PANEL WITH GFR
Anion gap: 4 — ABNORMAL LOW (ref 5–15)
BUN: 12 mg/dL (ref 6–20)
CO2: 25 mmol/L (ref 22–32)
Calcium: 9.3 mg/dL (ref 8.9–10.3)
Chloride: 106 mmol/L (ref 98–111)
Creatinine, Ser: 0.78 mg/dL (ref 0.44–1.00)
GFR, Estimated: >60 mL/min (ref >60)
Glucose, Bld: 107 mg/dL — ABNORMAL HIGH (ref 70–99)
Potassium: 4 mmol/L (ref 3.5–5.1)
Sodium: 135 mmol/L (ref 135–145)

## 2020-08-03 LAB — CBC
HCT: 23.2 % — ABNORMAL LOW (ref 36.0–46.0)
Hemoglobin: 7.4 g/dL — ABNORMAL LOW (ref 12.0–15.0)
MCH: 31.4 pg (ref 26.0–34.0)
MCHC: 31.9 g/dL (ref 30.0–36.0)
MCV: 98.3 fL (ref 80.0–100.0)
Platelets: 349 10*3/uL (ref 150–400)
RBC: 2.36 MIL/uL — ABNORMAL LOW (ref 3.87–5.11)
RDW: 14.5 % (ref 11.5–15.5)
WBC: 23.5 10*3/uL — ABNORMAL HIGH (ref 4.0–10.5)
nRBC: 0 % (ref 0.0–0.2)

## 2020-08-03 MED ORDER — LEVOTHYROXINE SODIUM 50 MCG PO TABS
50.0000 ug | ORAL_TABLET | Freq: Every day | ORAL | Status: DC
  Administered 2020-08-04 – 2020-08-07 (×4): 50 ug via ORAL
  Filled 2020-08-03 (×4): qty 1

## 2020-08-03 MED ORDER — SODIUM CHLORIDE 0.9 % IV SOLN
3.0000 g | Freq: Four times a day (QID) | INTRAVENOUS | Status: DC
  Administered 2020-08-03 – 2020-08-07 (×18): 3 g via INTRAVENOUS
  Filled 2020-08-03: qty 8
  Filled 2020-08-03 (×5): qty 3
  Filled 2020-08-03: qty 8
  Filled 2020-08-03: qty 3
  Filled 2020-08-03 (×4): qty 8
  Filled 2020-08-03 (×2): qty 3
  Filled 2020-08-03: qty 8
  Filled 2020-08-03 (×3): qty 3
  Filled 2020-08-03 (×2): qty 8
  Filled 2020-08-03: qty 3
  Filled 2020-08-03 (×2): qty 8

## 2020-08-03 NOTE — Progress Notes (Signed)
Order received from Dr Horris Latino for a clear liquid diet

## 2020-08-03 NOTE — Progress Notes (Signed)
PROGRESS NOTE  Desiree Gaines LFY:[id number] DOB: May 05, 1985 DOA: 08/02/2020 PCP: Juline Patch, MD  HPI/Recap of past 24 hours: Desiree Gaines is a 35 y.o. female with medical history significant for Graves' disease, cognitive disability, umbilical hernia who presents to the ED via EMS for evaluation of swelling involving the right side of her face, right tooth abscess draining pus for the past 3 days.  Reported some difficulty swallowing.  In the ED, vital signs stable except for tachycardia, Lab showed white count 18.6, hemoglobin 8.7. Respiratory viral panel is negative. Soft tissue neck CT shows odontogenic infection related to the lower right terminal molar with advanced multi spatial inflammation reaching the upper chest, displacing the tongue, and causing submucosal edema in the right hypopharynx and supraglottic larynx. No abscess.  Patient received IV Decadron and IV Unasyn.  Patient admitted for further management.      Today, patient reports swelling of the right side of her face has improved significantly, denies any fever/chills, difficulty swallowing saliva, chest pain, shortness of breath.  Patient very eager to eat.   Assessment/Plan: Principal Problem:   Facial cellulitis Active Problems:   Hypothyroidism   Facial cellulitis Currently afebrile, with leukocytosis (on steroids) Soft tissue neck CT shows odontogenic infection related to the lower right terminal molar No blood culture done on admission Continue IV Decadron, IV Unasyn Advance diet as tolerated Patient will need a dentist appointment scheduled to remove infected tooth  Normocytic anemia Baseline hemoglobin possibly around 9 Anemia panel, type and screen pending Daily CBC  Hypothyroidism Last TSH 66, will repeat Continue Synthroid  Large umbilical hernia Currently being followed by outpatient surgeons as per patient  Obesity Lifestyle modification advised      Estimated body mass  index is 33.99 kg/m as calculated from the following:   Height as of this encounter: 5\' 4"  (1.626 m).   Weight as of this encounter: 89.8 kg.     Code Status: Full  Family Communication: None at bedside  Disposition Plan: Status is: Inpatient  Remains inpatient appropriate because:Inpatient level of care appropriate due to severity of illness  Dispo: The patient is from: Home              Anticipated d/c is to: Home              Patient currently is not medically stable to d/c.   Difficult to place patient No     Consultants: None  Procedures: None  Antimicrobials: IV Unasyn  DVT prophylaxis: Lovenox   Objective: Vitals:   08/02/20 2018 08/03/20 0441 08/03/20 0747 08/03/20 1519  BP: 129/79 (!) 142/77 125/79 133/86  Pulse: 87 96 96 86  Resp: 18 16    Temp: 98.9 F (37.2 C) 98.1 F (36.7 C) 98.1 F (36.7 C) 98.1 F (36.7 C)  TempSrc: Oral  Oral Oral  SpO2: 100% 98% 95% 99%  Weight:      Height:        Intake/Output Summary (Last 24 hours) at 08/03/2020 1627 Last data filed at 08/03/2020 1403 Gross per 24 hour  Intake 3301.39 ml  Output --  Net 3301.39 ml   Filed Weights   08/02/20 0431  Weight: 89.8 kg    Exam: General: NAD, noted right side of face swollen including jaw Cardiovascular: S1, S2 present Respiratory: CTAB Abdomen: Soft, nontender, nondistended, bowel sounds present, large umbilical hernia present, tender Musculoskeletal: No bilateral pedal edema noted Skin: Normal Psychiatry: Normal mood    Data  Reviewed: CBC: Recent Labs  Lab 08/02/20 0445 08/03/20 0526  WBC 18.6* 23.5*  NEUTROABS 16.2*  --   HGB 8.7* 7.4*  HCT 27.6* 23.2*  MCV 98.6 98.3  PLT 372 185   Basic Metabolic Panel: Recent Labs  Lab 08/02/20 0613 08/03/20 0526  NA 132* 135  K 3.8 4.0  CL 99 106  CO2 28 25  GLUCOSE 101* 107*  BUN 8 12  CREATININE 0.76 0.78  CALCIUM 9.8 9.3   GFR: Estimated Creatinine Clearance: 108.5 mL/min (by C-G formula based  on SCr of 0.78 mg/dL). Liver Function Tests: No results for input(s): AST, ALT, ALKPHOS, BILITOT, PROT, ALBUMIN in the last 168 hours. No results for input(s): LIPASE, AMYLASE in the last 168 hours. No results for input(s): AMMONIA in the last 168 hours. Coagulation Profile: No results for input(s): INR, PROTIME in the last 168 hours. Cardiac Enzymes: No results for input(s): CKTOTAL, CKMB, CKMBINDEX, TROPONINI in the last 168 hours. BNP (last 3 results) No results for input(s): PROBNP in the last 8760 hours. HbA1C: No results for input(s): HGBA1C in the last 72 hours. CBG: No results for input(s): GLUCAP in the last 168 hours. Lipid Profile: No results for input(s): CHOL, HDL, LDLCALC, TRIG, CHOLHDL, LDLDIRECT in the last 72 hours. Thyroid Function Tests: No results for input(s): TSH, T4TOTAL, FREET4, T3FREE, THYROIDAB in the last 72 hours. Anemia Panel: No results for input(s): VITAMINB12, FOLATE, FERRITIN, TIBC, IRON, RETICCTPCT in the last 72 hours. Urine analysis:    Component Value Date/Time   COLORURINE Yellow 07/17/2011 0725   APPEARANCEUR Hazy 07/17/2011 0725   LABSPEC 1.017 07/17/2011 0725   PHURINE 7.0 07/17/2011 0725   GLUCOSEU Negative 07/17/2011 0725   HGBUR 2+ 07/17/2011 0725   BILIRUBINUR Negative 07/17/2011 0725   KETONESUR Negative 07/17/2011 0725   PROTEINUR 100 mg/dL 07/17/2011 0725   NITRITE Negative 07/17/2011 0725   LEUKOCYTESUR Trace 07/17/2011 0725   Sepsis Labs: @LABRCNTIP (procalcitonin:4,lacticidven:4)  ) Recent Results (from the past 240 hour(s))  Resp Panel by RT-PCR (Flu A&B, Covid) Nasopharyngeal Swab     Status: None   Collection Time: 08/02/20  6:43 AM   Specimen: Nasopharyngeal Swab; Nasopharyngeal(NP) swabs in vial transport medium  Result Value Ref Range Status   SARS Coronavirus 2 by RT PCR NEGATIVE NEGATIVE Final    Comment: (NOTE) SARS-CoV-2 target nucleic acids are NOT DETECTED.  The SARS-CoV-2 RNA is generally detectable in  upper respiratory specimens during the acute phase of infection. The lowest concentration of SARS-CoV-2 viral copies this assay can detect is 138 copies/mL. A negative result does not preclude SARS-Cov-2 infection and should not be used as the sole basis for treatment or other patient management decisions. A negative result may occur with  improper specimen collection/handling, submission of specimen other than nasopharyngeal swab, presence of viral mutation(s) within the areas targeted by this assay, and inadequate number of viral copies(<138 copies/mL). A negative result must be combined with clinical observations, patient history, and epidemiological information. The expected result is Negative.  Fact Sheet for Patients:  EntrepreneurPulse.com.au  Fact Sheet for Healthcare Providers:  IncredibleEmployment.be  This test is no t yet approved or cleared by the [place] FDA and  has been authorized for detection and/or diagnosis of SARS-CoV-2 by FDA under an Emergency Use Authorization (EUA). This EUA will remain  in effect (meaning this test can be used) for the duration of the COVID-19 declaration under Section 564(b)(1) of the Act, 21 U.S.C.section 360bbb-3(b)(1), unless the authorization is terminated  or revoked sooner.       Influenza A by PCR NEGATIVE NEGATIVE Final   Influenza B by PCR NEGATIVE NEGATIVE Final    Comment: (NOTE) The Xpert Xpress SARS-CoV-2/FLU/RSV plus assay is intended as an aid in the diagnosis of influenza from Nasopharyngeal swab specimens and should not be used as a sole basis for treatment. Nasal washings and aspirates are unacceptable for Xpert Xpress SARS-CoV-2/FLU/RSV testing.  Fact Sheet for Patients: EntrepreneurPulse.com.au  Fact Sheet for Healthcare Providers: IncredibleEmployment.be  This test is not yet approved or cleared by the [place] FDA and has been  authorized for detection and/or diagnosis of SARS-CoV-2 by FDA under an Emergency Use Authorization (EUA). This EUA will remain in effect (meaning this test can be used) for the duration of the COVID-19 declaration under Section 564(b)(1) of the Act, 21 U.S.C. section 360bbb-3(b)(1), unless the authorization is terminated or revoked.  Performed at [facility], [address] [place], [place] [address]       Studies: No results found.  Scheduled Meds:  dexamethasone (DECADRON) injection  4 mg Intravenous Q6H   enoxaparin (LOVENOX) injection  40 mg Subcutaneous Q24H    Continuous Infusions:  sodium chloride 100 mL/hr at 08/03/20 1612   ampicillin-sulbactam (UNASYN) IV 3 g (08/03/20 1612)     LOS: 1 day     Alma Friendly, MD [facility]  If 7PM-7AM, please contact night-coverage [email or web address] 08/03/2020, 4:27 PM

## 2020-08-03 NOTE — Progress Notes (Signed)
Patient is tolerating clear liquids well. She is requesting to try some soup. Dr Horris Latino notified and diet changed to full liquid

## 2020-08-04 LAB — CBC WITH DIFFERENTIAL/PLATELET
Abs Immature Granulocytes: 1.36 10*3/uL — ABNORMAL HIGH (ref 0.00–0.07)
Basophils Absolute: 0 10*3/uL (ref 0.0–0.1)
Basophils Relative: 0 %
Eosinophils Absolute: 0 10*3/uL (ref 0.0–0.5)
Eosinophils Relative: 0 %
HCT: 23.5 % — ABNORMAL LOW (ref 36.0–46.0)
Hemoglobin: 7.5 g/dL — ABNORMAL LOW (ref 12.0–15.0)
Immature Granulocytes: 7 %
Lymphocytes Relative: 6 %
Lymphs Abs: 1.1 10*3/uL (ref 0.7–4.0)
MCH: 31.5 pg (ref 26.0–34.0)
MCHC: 31.9 g/dL (ref 30.0–36.0)
MCV: 98.7 fL (ref 80.0–100.0)
Monocytes Absolute: 0.7 10*3/uL (ref 0.1–1.0)
Monocytes Relative: 3 %
Neutro Abs: 16.8 10*3/uL — ABNORMAL HIGH (ref 1.7–7.7)
Neutrophils Relative %: 84 %
Platelets: 380 10*3/uL (ref 150–400)
RBC: 2.38 MIL/uL — ABNORMAL LOW (ref 3.87–5.11)
RDW: 14.6 % (ref 11.5–15.5)
Smear Review: NORMAL
WBC: 19.9 10*3/uL — ABNORMAL HIGH (ref 4.0–10.5)
nRBC: 0.2 % (ref 0.0–0.2)

## 2020-08-04 LAB — BASIC METABOLIC PANEL
Anion gap: 7 (ref 5–15)
BUN: 14 mg/dL (ref 6–20)
CO2: 24 mmol/L (ref 22–32)
Calcium: 9.7 mg/dL (ref 8.9–10.3)
Chloride: 107 mmol/L (ref 98–111)
Creatinine, Ser: 0.95 mg/dL (ref 0.44–1.00)
GFR, Estimated: >60 mL/min (ref >60)
Glucose, Bld: 108 mg/dL — ABNORMAL HIGH (ref 70–99)
Potassium: 4.7 mmol/L (ref 3.5–5.1)
Sodium: 138 mmol/L (ref 135–145)

## 2020-08-04 LAB — IRON AND TIBC
Iron: 84 ug/dL (ref 28–170)
Saturation Ratios: 28 % (ref 10.4–31.8)
TIBC: 297 ug/dL (ref 250–450)
UIBC: 213 ug/dL

## 2020-08-04 LAB — VITAMIN B12: Vitamin B-12: 214 pg/mL (ref 180–914)

## 2020-08-04 LAB — FOLATE: Folate: 5.2 ng/mL — ABNORMAL LOW (ref >5.9)

## 2020-08-04 LAB — FERRITIN: Ferritin: 119 ng/mL (ref 11–307)

## 2020-08-04 LAB — T4, FREE: Free T4: 0.32 ng/dL — ABNORMAL LOW (ref 0.61–1.12)

## 2020-08-04 LAB — TSH: TSH: 27.941 u[IU]/mL — ABNORMAL HIGH (ref 0.350–4.500)

## 2020-08-04 MED ORDER — SODIUM CHLORIDE 0.9 % IV SOLN
1.0000 mg | Freq: Once | INTRAVENOUS | Status: AC
  Administered 2020-08-04: 1 mg via INTRAVENOUS
  Filled 2020-08-04: qty 0.2

## 2020-08-04 MED ORDER — IBUPROFEN 100 MG/5ML PO SUSP
400.0000 mg | Freq: Three times a day (TID) | ORAL | Status: DC | PRN
  Filled 2020-08-04 (×2): qty 20

## 2020-08-04 MED ORDER — IBUPROFEN 100 MG/5ML PO SUSP
400.0000 mg | Freq: Three times a day (TID) | ORAL | Status: DC | PRN
  Administered 2020-08-04 – 2020-08-07 (×4): 400 mg via ORAL
  Filled 2020-08-04 (×5): qty 20

## 2020-08-04 MED ORDER — VITAMIN B-12 1000 MCG PO TABS
1000.0000 ug | ORAL_TABLET | Freq: Every day | ORAL | Status: DC
  Administered 2020-08-06 – 2020-08-07 (×2): 1000 ug via ORAL
  Filled 2020-08-04 (×2): qty 1

## 2020-08-04 NOTE — Progress Notes (Signed)
PROGRESS NOTE    Desiree Gaines  ION:[id number] DOB: Jul 25, 1985 DOA: 08/02/2020 PCP: Juline Patch, MD  Chief Complaint  Patient presents with   Oral Swelling   Brief Narrative:  Desiree Gaines is Desiree Gaines 35 y.o. female with medical history significant for Graves' disease, cognitive disability, umbilical hernia who presents to the ED via EMS for evaluation of swelling involving the right side of her face, right tooth abscess draining pus for the past 3 days.  Reported some difficulty swallowing.  In the ED, vital signs stable except for tachycardia, Lab showed white count 18.6, hemoglobin 8.7. Respiratory viral panel is negative. Soft tissue neck CT shows odontogenic infection related to the lower right terminal molar with advanced multi spatial inflammation reaching the upper chest, displacing the tongue, and causing submucosal edema in the right hypopharynx and supraglottic larynx. No abscess.  Patient received IV Decadron and IV Unasyn.  Patient admitted for further management.    Assessment & Plan:   Principal Problem:   Facial cellulitis Active Problems:   Hypothyroidism   Facial cellulitis  Odontogenic Infection Currently afebrile, with leukocytosis (on steroids) Soft tissue neck CT shows odontogenic infection related to the lower right terminal molar with multispatial inflammation reaching upper chest, displacing tongue, and causing submucosal edema in R hypopharynx and supraglottic larynx Case discussed with ENT in ED, recommended steroids/abx Discussed again today regarding course of steroids, recommended taper and dental f/u on appropriate abx Continue IV Decadron, IV Unasyn - consider oral taper tomorrow pending improvement Advance diet as tolerated Patient will need Chandni Gagan dentist appointment scheduled to remove infected tooth   Normocytic anemia  Folate Deficiency  Low Normal B12 Baseline hemoglobin possibly around 9 Labs suggestive of folate and possible b12  deficiency, follow MMA Hypothyroidism likely contributing Replace and follow Daily CBC   Hypothyroidism Last TSH 66, will repeat Improved, continue synthroid   Large umbilical hernia Currently being followed by outpatient surgeons as per patient   Obesity Lifestyle modification advised        Estimated body mass index is 33.99 kg/m as calculated from the following:   Height as of this encounter: 5\' 4"  (1.626 m).   Weight as of this encounter: 89.8 kg.    DVT prophylaxis: lovenox Code Status: full  Family Communication: none at bedside Disposition:   Status is: Inpatient  Remains inpatient appropriate because:Inpatient level of care appropriate due to severity of illness  Dispo: The patient is from: Home              Anticipated d/c is to: Home              Patient currently is not medically stable to d/c.   Difficult to place patient No   Consultants:  ENT over phone  Procedures:  none  Antimicrobials:  Anti-infectives (From admission, onward)    Start     Dose/Rate Route Frequency Ordered Stop   08/03/20 0145  Ampicillin-Sulbactam (UNASYN) 3 g in sodium chloride 0.9 % 100 mL IVPB        3 g 200 mL/hr over 30 Minutes Intravenous Every 6 hours 08/03/20 0056     08/02/20 0515  Ampicillin-Sulbactam (UNASYN) 3 g in sodium chloride 0.9 % 100 mL IVPB        3 g 200 mL/hr over 30 Minutes Intravenous  Once 08/02/20 0502 08/02/20 0545          Subjective: Feels about the same, continued swelling  Objective: Vitals:   08/03/20 1933  08/04/20 0324 08/04/20 0756 08/04/20 1530  BP: 131/87 (!) 148/91 (!) 139/95 (!) 130/91  Pulse: 86 82 81 83  Resp: 16 18    Temp: 98.2 F (36.8 C) 98.9 F (37.2 C) 97.7 F (36.5 C) 98 F (36.7 C)  TempSrc:  Oral Oral Oral  SpO2: 99% 99% 97% 100%  Weight:      Height:        Intake/Output Summary (Last 24 hours) at 08/04/2020 1816 Last data filed at 08/04/2020 1036 Gross per 24 hour  Intake 2049.13 ml  Output --  Net  2049.13 ml   Filed Weights   08/02/20 0431  Weight: 89.8 kg    Examination:  General exam: Appears calm and comfortable  HEENT: right sided facial swelling, firmness to submandibular space on R Respiratory system: Clear to auscultation. Respiratory effort normal. Cardiovascular system: S1 & S2 heard, RRR Gastrointestinal system: large hernia Central nervous system: Alert and oriented. No focal neurological deficits. Extremities: no LEE Skin: No rashes, lesions or ulcers Psychiatry: Judgement and insight appear normal. Mood & affect appropriate.     Data Reviewed: I have personally reviewed following labs and imaging studies  CBC: Recent Labs  Lab 08/02/20 0445 08/03/20 0526 08/04/20 0539  WBC 18.6* 23.5* 19.9*  NEUTROABS 16.2*  --  16.8*  HGB 8.7* 7.4* 7.5*  HCT 27.6* 23.2* 23.5*  MCV 98.6 98.3 98.7  PLT 372 349 937    Basic Metabolic Panel: Recent Labs  Lab 08/02/20 0613 08/03/20 0526 08/04/20 0539  NA 132* 135 138  K 3.8 4.0 4.7  CL 99 106 107  CO2 28 25 24   GLUCOSE 101* 107* 108*  BUN 8 12 14   CREATININE 0.76 0.78 0.95  CALCIUM 9.8 9.3 9.7    GFR: Estimated Creatinine Clearance: 91.3 mL/min (by C-G formula based on SCr of 0.95 mg/dL).  Liver Function Tests: No results for input(s): AST, ALT, ALKPHOS, BILITOT, PROT, ALBUMIN in the last 168 hours.  CBG: No results for input(s): GLUCAP in the last 168 hours.   Recent Results (from the past 240 hour(s))  Resp Panel by RT-PCR (Flu Virda Betters&B, Covid) Nasopharyngeal Swab     Status: None   Collection Time: 08/02/20  6:43 AM   Specimen: Nasopharyngeal Swab; Nasopharyngeal(NP) swabs in vial transport medium  Result Value Ref Range Status   SARS Coronavirus 2 by RT PCR NEGATIVE NEGATIVE Final    Comment: (NOTE) SARS-CoV-2 target nucleic acids are NOT DETECTED.  The SARS-CoV-2 RNA is generally detectable in upper respiratory specimens during the acute phase of infection. The lowest concentration of  SARS-CoV-2 viral copies this assay can detect is 138 copies/mL. Tavis Kring negative result does not preclude SARS-Cov-2 infection and should not be used as the sole basis for treatment or other patient management decisions. Xavier Fournier negative result may occur with  improper specimen collection/handling, submission of specimen other than nasopharyngeal swab, presence of viral mutation(s) within the areas targeted by this assay, and inadequate number of viral copies(<138 copies/mL). [place] negative result must be combined with clinical observations, patient history, and epidemiological information. The expected result is Negative.  Fact Sheet for Patients:  EntrepreneurPulse.com.au  Fact Sheet for Healthcare Providers:  IncredibleEmployment.be  This test is no t yet approved or cleared by the [place] FDA and  has been authorized for detection and/or diagnosis of SARS-CoV-2 by FDA under an Emergency Use Authorization (EUA). This EUA will remain  in effect (meaning this test can be used) for the duration of the COVID-19  declaration under Section 564(b)(1) of the Act, 21 U.S.C.section 360bbb-3(b)(1), unless the authorization is terminated  or revoked sooner.       Influenza Anwar Crill by PCR NEGATIVE NEGATIVE Final   Influenza B by PCR NEGATIVE NEGATIVE Final    Comment: (NOTE) The Xpert Xpress SARS-CoV-2/FLU/RSV plus assay is intended as an aid in the diagnosis of influenza from Nasopharyngeal swab specimens and should not be used as Khian Remo sole basis for treatment. Nasal washings and aspirates are unacceptable for Xpert Xpress SARS-CoV-2/FLU/RSV testing.  Fact Sheet for Patients: EntrepreneurPulse.com.au  Fact Sheet for Healthcare Providers: IncredibleEmployment.be  This test is not yet approved or cleared by the [place] FDA and has been authorized for detection and/or diagnosis of SARS-CoV-2 by FDA under an Emergency Use  Authorization (EUA). This EUA will remain in effect (meaning this test can be used) for the duration of the COVID-19 declaration under Section 564(b)(1) of the Act, 21 U.S.C. section 360bbb-3(b)(1), unless the authorization is terminated or revoked.  Performed at [facility], [address] [place], [place] [address]          Radiology Studies: No results found.      Scheduled Meds:  dexamethasone (DECADRON) injection  4 mg Intravenous Q6H   enoxaparin (LOVENOX) injection  40 mg Subcutaneous Q24H   levothyroxine  50 mcg Oral Daily   Continuous Infusions:  sodium chloride 100 mL/hr at 08/04/20 1355   ampicillin-sulbactam (UNASYN) IV 3 g (08/04/20 1734)     LOS: 2 days    Time spent: over 30 min    Fayrene Helper, MD [facility]   To contact the attending provider between 7A-7P or the covering provider during after hours 7P-7A, please log into the web site [email or web address] and access using universal [place] password for that web site. If you do not have the password, please call the hospital operator.  08/04/2020, 6:16 PM

## 2020-08-05 ENCOUNTER — Inpatient Hospital Stay: Payer: Medicare Other

## 2020-08-05 LAB — BASIC METABOLIC PANEL
Anion gap: 4 — ABNORMAL LOW (ref 5–15)
BUN: 12 mg/dL (ref 6–20)
CO2: 23 mmol/L (ref 22–32)
Calcium: 9.2 mg/dL (ref 8.9–10.3)
Chloride: 109 mmol/L (ref 98–111)
Creatinine, Ser: 0.8 mg/dL (ref 0.44–1.00)
GFR, Estimated: >60 mL/min (ref >60)
Glucose, Bld: 114 mg/dL — ABNORMAL HIGH (ref 70–99)
Potassium: 4.4 mmol/L (ref 3.5–5.1)
Sodium: 136 mmol/L (ref 135–145)

## 2020-08-05 LAB — CBC WITH DIFFERENTIAL/PLATELET
Abs Immature Granulocytes: 1.92 10*3/uL — ABNORMAL HIGH (ref 0.00–0.07)
Basophils Absolute: 0 10*3/uL (ref 0.0–0.1)
Basophils Relative: 0 %
Eosinophils Absolute: 0 10*3/uL (ref 0.0–0.5)
Eosinophils Relative: 0 %
HCT: 23.6 % — ABNORMAL LOW (ref 36.0–46.0)
Hemoglobin: 7.6 g/dL — ABNORMAL LOW (ref 12.0–15.0)
Immature Granulocytes: 10 %
Lymphocytes Relative: 9 %
Lymphs Abs: 1.7 10*3/uL (ref 0.7–4.0)
MCH: 31.4 pg (ref 26.0–34.0)
MCHC: 32.2 g/dL (ref 30.0–36.0)
MCV: 97.5 fL (ref 80.0–100.0)
Monocytes Absolute: 0.5 10*3/uL (ref 0.1–1.0)
Monocytes Relative: 3 %
Neutro Abs: 15.5 10*3/uL — ABNORMAL HIGH (ref 1.7–7.7)
Neutrophils Relative %: 78 %
Platelets: 400 10*3/uL (ref 150–400)
RBC: 2.42 MIL/uL — ABNORMAL LOW (ref 3.87–5.11)
RDW: 14.4 % (ref 11.5–15.5)
Smear Review: NORMAL
WBC: 19.7 10*3/uL — ABNORMAL HIGH (ref 4.0–10.5)
nRBC: 0.3 % — ABNORMAL HIGH (ref 0.0–0.2)

## 2020-08-05 MED ORDER — DEXAMETHASONE SODIUM PHOSPHATE 4 MG/ML IJ SOLN
4.0000 mg | Freq: Two times a day (BID) | INTRAMUSCULAR | Status: AC
  Administered 2020-08-06 (×2): 4 mg via INTRAVENOUS
  Filled 2020-08-05 (×2): qty 1

## 2020-08-05 MED ORDER — IOHEXOL 300 MG/ML  SOLN
75.0000 mL | Freq: Once | INTRAMUSCULAR | Status: AC | PRN
  Administered 2020-08-05: 75 mL via INTRAVENOUS

## 2020-08-05 MED ORDER — FOLIC ACID 5 MG/ML IJ SOLN
1.0000 mg | Freq: Every day | INTRAMUSCULAR | Status: DC
  Administered 2020-08-06: 1 mg via INTRAVENOUS
  Filled 2020-08-05: qty 0.2

## 2020-08-05 NOTE — Progress Notes (Addendum)
PROGRESS NOTE    DARCEL ZICK  TZG:[id number] DOB: Apr 11, 1985 DOA: 08/02/2020 PCP: Juline Patch, MD  Chief Complaint  Patient presents with   Oral Swelling   Brief Narrative:  Desiree Gaines is Desiree Gaines 35 y.o. female with medical history significant for Graves' disease, cognitive disability, umbilical hernia who presents to the ED via EMS for evaluation of swelling involving the right side of her face, right tooth abscess draining pus for the past 3 days.  Reported some difficulty swallowing.  In the ED, vital signs stable except for tachycardia, Lab showed white count 18.6, hemoglobin 8.7. Respiratory viral panel is negative. Soft tissue neck CT shows odontogenic infection related to the lower right terminal molar with advanced multi spatial inflammation reaching the upper chest, displacing the tongue, and causing submucosal edema in the right hypopharynx and supraglottic larynx. No abscess.  Patient received IV Decadron and IV Unasyn.  Patient admitted for further management.    Assessment & Plan:   Principal Problem:   Facial cellulitis Active Problems:   Hypothyroidism   Facial cellulitis  Odontogenic Infection Currently afebrile, with leukocytosis (on steroids) Soft tissue neck CT shows odontogenic infection related to the lower right terminal molar with multispatial inflammation reaching upper chest, displacing tongue, and causing submucosal edema in R hypopharynx and supraglottic larynx Repeat CT here today with improved cellulitis and myositis in R neck, decreased swelling of R pterygoid and masseter muscle.  Improvement in mucosal and submucosal edema in R lateral pharynx.  Periapical lucency around R lower third molar noted with erosion of medial cortex. Case discussed with ENT in ED, recommended steroids/abx Will plan to taper steroids Need to ensure she's able to follow with dental  Continue IV Decadron, IV Unasyn - consider oral taper tomorrow pending  improvement Advance diet as tolerated Patient will need [facility] dentist appointment scheduled to remove infected tooth   Normocytic anemia  Folate Deficiency  Low Normal B12 Baseline hemoglobin possibly around 9 Labs suggestive of folate and possible b12 deficiency, follow MMA Hypothyroidism likely contributing Replace and follow Daily CBC   Hypothyroidism Last TSH 66, will repeat Improved, continue synthroid   Large umbilical hernia Currently being followed by outpatient surgeons as per patient   Obesity Lifestyle modification advised   LE edema LE [place] without DVT      Estimated body mass index is 33.99 kg/m as calculated from the following:   Height as of this encounter: 5\' 4"  (1.626 m).   Weight as of this encounter: 89.8 kg.    DVT prophylaxis: lovenox Code Status: full  Family Communication: none at bedside Disposition:   Status is: Inpatient  Remains inpatient appropriate because:Inpatient level of care appropriate due to severity of illness  Dispo: The patient is from: Home              Anticipated d/c is to: Home              Patient currently is not medically stable to d/c.   Difficult to place patient No   Consultants:  ENT over phone  Procedures:  none  Antimicrobials:  Anti-infectives (From admission, onward)    Start     Dose/Rate Route Frequency Ordered Stop   08/03/20 0145  Ampicillin-Sulbactam (UNASYN) 3 g in sodium chloride 0.9 % 100 mL IVPB        3 g 200 mL/hr over 30 Minutes Intravenous Every 6 hours 08/03/20 0056     08/02/20 0515  Ampicillin-Sulbactam (UNASYN) 3 g in  sodium chloride 0.9 % 100 mL IVPB        3 g 200 mL/hr over 30 Minutes Intravenous  Once 08/02/20 0502 08/02/20 0545          Subjective: No new complaints - still has sore throat and soreness under r submandibular space  Objective: Vitals:   08/05/20 0429 08/05/20 0750 08/05/20 0751 08/05/20 1514  BP: (!) 140/96 (!) 139/101 (!) 138/97 (!) 142/93  Pulse: 75 68 66  77  Resp: 16 16  16   Temp: 98.2 F (36.8 C) 97.9 F (36.6 C)  97.6 F (36.4 C)  TempSrc: Oral Oral  Oral  SpO2: 98% 100%  100%  Weight:      Height:        Intake/Output Summary (Last 24 hours) at 08/05/2020 1845 Last data filed at 08/05/2020 1048 Gross per 24 hour  Intake 2413.43 ml  Output --  Net 2413.43 ml   Filed Weights   08/02/20 0431  Weight: 89.8 kg    Examination:  General: No acute distress. Cardiovascular: Heart sounds show Shanice Poznanski regular rate, and rhythm.  Lungs: Clear to auscultation bilaterally  Abdomen: Soft, nontender, nondistended  Neurological: Alert and oriented 3. Moves all extremities 4 . Cranial nerves II through XII grossly intact. Skin: Warm and dry. No rashes or lesions. Extremities: No clubbing or cyanosis. No edema.   Data Reviewed: I have personally reviewed following labs and imaging studies  CBC: Recent Labs  Lab 08/02/20 0445 08/03/20 0526 08/04/20 0539 08/05/20 0423  WBC 18.6* 23.5* 19.9* 19.7*  NEUTROABS 16.2*  --  16.8* 15.5*  HGB 8.7* 7.4* 7.5* 7.6*  HCT 27.6* 23.2* 23.5* 23.6*  MCV 98.6 98.3 98.7 97.5  PLT 372 349 380 009    Basic Metabolic Panel: Recent Labs  Lab 08/02/20 0613 08/03/20 0526 08/04/20 0539 08/05/20 0423  NA 132* 135 138 136  K 3.8 4.0 4.7 4.4  CL 99 106 107 109  CO2 28 25 24 23   GLUCOSE 101* 107* 108* 114*  BUN 8 12 14 12   CREATININE 0.76 0.78 0.95 0.80  CALCIUM 9.8 9.3 9.7 9.2    GFR: Estimated Creatinine Clearance: 108.5 mL/min (by C-G formula based on SCr of 0.8 mg/dL).  Liver Function Tests: No results for input(s): AST, ALT, ALKPHOS, BILITOT, PROT, ALBUMIN in the last 168 hours.  CBG: No results for input(s): GLUCAP in the last 168 hours.   Recent Results (from the past 240 hour(s))  Resp Panel by RT-PCR (Flu Khiara Shuping&B, Covid) Nasopharyngeal Swab     Status: None   Collection Time: 08/02/20  6:43 AM   Specimen: Nasopharyngeal Swab; Nasopharyngeal(NP) swabs in vial transport medium  Result  Value Ref Range Status   SARS Coronavirus 2 by RT PCR NEGATIVE NEGATIVE Final    Comment: (NOTE) SARS-CoV-2 target nucleic acids are NOT DETECTED.  The SARS-CoV-2 RNA is generally detectable in upper respiratory specimens during the acute phase of infection. The lowest concentration of SARS-CoV-2 viral copies this assay can detect is 138 copies/mL. Oliver Neuwirth negative result does not preclude SARS-Cov-2 infection and should not be used as the sole basis for treatment or other patient management decisions. Arryanna Holquin negative result may occur with  improper specimen collection/handling, submission of specimen other than nasopharyngeal swab, presence of viral mutation(s) within the areas targeted by this assay, and inadequate number of viral copies(<138 copies/mL). Trenika Hudson negative result must be combined with clinical observations, patient history, and epidemiological information. The expected result is Negative.  Fact Sheet for  Patients:  EntrepreneurPulse.com.au  Fact Sheet for Healthcare Providers:  IncredibleEmployment.be  This test is no t yet approved or cleared by the [place] FDA and  has been authorized for detection and/or diagnosis of SARS-CoV-2 by FDA under an Emergency Use Authorization (EUA). This EUA will remain  in effect (meaning this test can be used) for the duration of the COVID-19 declaration under Section 564(b)(1) of the Act, 21 U.S.C.section 360bbb-3(b)(1), unless the authorization is terminated  or revoked sooner.       Influenza Anterio Scheel by PCR NEGATIVE NEGATIVE Final   Influenza B by PCR NEGATIVE NEGATIVE Final    Comment: (NOTE) The Xpert Xpress SARS-CoV-2/FLU/RSV plus assay is intended as an aid in the diagnosis of influenza from Nasopharyngeal swab specimens and should not be used as Ethelreda Sukhu sole basis for treatment. Nasal washings and aspirates are unacceptable for Xpert Xpress SARS-CoV-2/FLU/RSV testing.  Fact Sheet for  Patients: EntrepreneurPulse.com.au  Fact Sheet for Healthcare Providers: IncredibleEmployment.be  This test is not yet approved or cleared by the [place] FDA and has been authorized for detection and/or diagnosis of SARS-CoV-2 by FDA under an Emergency Use Authorization (EUA). This EUA will remain in effect (meaning this test can be used) for the duration of the COVID-19 declaration under Section 564(b)(1) of the Act, 21 U.S.C. section 360bbb-3(b)(1), unless the authorization is terminated or revoked.  Performed at [facility], [address], [place], [place] [address]          Radiology Studies: CT SOFT TISSUE NECK W CONTRAST  Result Date: 08/05/2020 CLINICAL DATA:  Odontogenic infection. Right lower tooth infection. Right facial swelling. EXAM: CT NECK WITH CONTRAST TECHNIQUE: Multidetector CT imaging of the neck was performed using the standard protocol following the bolus administration of intravenous contrast. CONTRAST:  56mL OMNIPAQUE IOHEXOL 300 MG/ML  SOLN COMPARISON:  CT neck 08/02/2020 FINDINGS: Pharynx and larynx: Significant improvement in the mucosal and submucosal edema right lateral pharynx compared to the recent CT. No airway compromise. Epiglottis and larynx normal. Salivary glands: No inflammation, mass, or stone. Thyroid: Hypoplastic thyroid. Lymph nodes: No enlarged or necrotic lymph nodes. Vascular: Normal vascular enhancement. Limited intracranial: Negative Visualized orbits: Negative Mastoids and visualized paranasal sinuses: Negative Skeleton: Again noted is periapical lucency around right lower third molar with erosion through the medial cortex compatible with dental infection. Additional periapical lucency around right lower first molar. Caries in the left upper molar and lower molars. Upper chest: Small pleural effusions. Lung apices otherwise clear. Stranding in the anterior mediastinum appears improved.  Aberrant right subclavian artery. Other: Improved soft tissue swelling in the right neck. There remains enlargement of the right masseter muscle and right pterygoid muscle with improvement. There is improved stranding in the subcutaneous fat in the right face. No soft tissue abscess identified. IMPRESSION: Compared with 3 days ago, there is improvement in cellulitis and myositis in the right neck. Decreased swelling of the right pterygoid and masseter muscle. There is improvement in mucosal and submucosal edema in the right lateral pharynx. No airway compromise. Periapical lucency around right lower third molar again noted with erosion of the medial cortex. No soft tissue abscess. Electronically Signed   By: Franchot Gallo M.D.   On: 08/05/2020 18:18   US Venous Img Lower Bilateral (DVT)  Result Date: 08/05/2020 CLINICAL DATA:  35 year old female with lower extremity edema. EXAM: BILATERAL LOWER EXTREMITY VENOUS DOPPLER ULTRASOUND TECHNIQUE: Gray-scale sonography with graded compression, as well as color Doppler and duplex ultrasound were performed to evaluate  the lower extremity deep venous systems from the level of the common femoral vein and including the common femoral, femoral, profunda femoral, popliteal and calf veins including the posterior tibial, peroneal and gastrocnemius veins when visible. The superficial great saphenous vein was also interrogated. Spectral Doppler was utilized to evaluate flow at rest and with distal augmentation maneuvers in the common femoral, femoral and popliteal veins. COMPARISON:  None. FINDINGS: RIGHT LOWER EXTREMITY Common Femoral Vein: No evidence of thrombus. Normal compressibility, respiratory phasicity and response to augmentation. Saphenofemoral Junction: No evidence of thrombus. Normal compressibility and flow on color Doppler imaging. Profunda Femoral Vein: No evidence of thrombus. Normal compressibility and flow on color Doppler imaging. Femoral Vein: No evidence of  thrombus. Normal compressibility, respiratory phasicity and response to augmentation. Popliteal Vein: No evidence of thrombus. Normal compressibility, respiratory phasicity and response to augmentation. Calf Veins: No evidence of thrombus. Normal compressibility and flow on color Doppler imaging. Other Findings:  None. LEFT LOWER EXTREMITY Common Femoral Vein: No evidence of thrombus. Normal compressibility, respiratory phasicity and response to augmentation. Saphenofemoral Junction: No evidence of thrombus. Normal compressibility and flow on color Doppler imaging. Profunda Femoral Vein: No evidence of thrombus. Normal compressibility and flow on color Doppler imaging. Femoral Vein: No evidence of thrombus. Normal compressibility, respiratory phasicity and response to augmentation. Popliteal Vein: No evidence of thrombus. Normal compressibility, respiratory phasicity and response to augmentation. Calf Veins: No evidence of thrombus. Normal compressibility and flow on color Doppler imaging. Other Findings:  None. IMPRESSION: No evidence of bilateral lower extremity deep venous thrombosis. Ruthann Cancer, MD [facility] Electronically Signed   By: Ruthann Cancer MD   On: 08/05/2020 15:10        Scheduled Meds:  dexamethasone (DECADRON) injection  4 mg Intravenous Q6H   enoxaparin (LOVENOX) injection  40 mg Subcutaneous Q24H   [START ON 10/07/6604] folic acid  1 mg Intravenous Daily   levothyroxine  50 mcg Oral Daily   vitamin B-12  1,000 mcg Oral Daily   Continuous Infusions:  sodium chloride 100 mL/hr at 08/05/20 1457   ampicillin-sulbactam (UNASYN) IV 3 g (08/05/20 1659)     LOS: 3 days    Time spent: over 34 min    Fayrene Helper, MD [facility]   To contact the attending provider between 7A-7P or the covering provider during after hours 7P-7A, please log into the web site [email or web address] and access using universal [place]  password for that web site. If you do not have the password, please call the hospital operator.  08/05/2020, 6:45 PM

## 2020-08-05 NOTE — Care Management Important Message (Signed)
Important Message  Patient Details  Name: Desiree Gaines MRN: [id number] Date of Birth: 1985-08-03   Medicare Important Message Given:  Yes     Dannette Barbara 08/05/2020, 12:23 PM

## 2020-08-05 NOTE — Progress Notes (Signed)
Mobility Specialist - Progress Note   08/05/20 1600  Mobility  Activity Ambulated in hall  Level of Assistance Standby assist, set-up cues, supervision of patient - no hands on  Assistive Device Other (Comment) (IV Pole)  Distance Ambulated (ft) 480 ft  Mobility Ambulated with assistance in hallway  Mobility Response Tolerated well  Mobility performed by Mobility specialist  $Mobility charge 1 Mobility    During mobility: 108 HR, 98% SpO2   Pt lying semi-supine upon arrival, utilizing RA. Motivated for OOB activity. Pt ambulated in hallway with supervision, pushing IV pole. No LOB. Does report moderate dizziness during ambulation. No pain in LE with activity. Pt left in bed with all needs in reach.    Kathee Delton Mobility Specialist 08/05/20, 4:29 PM

## 2020-08-05 NOTE — TOC Progression Note (Signed)
Transition of Care [facility]) - Progression Note    Patient Details  Name: Desiree Gaines MRN: [id number] Date of Birth: 1985-11-18  Transition of Care [facility]) CM/SW [place], RN Phone Number: 08/05/2020, 2:32 PM  Clinical Narrative: Spoke with patient about discharge Dental appointment. Patient voices not having a Dental provider, a Dental resource sheet given for patient to call and schedule appointment, patient voices understanding.          Expected Discharge Plan and Services                                                 Social Determinants of Health (SDOH) Interventions    Readmission Risk Interventions No flowsheet data found.

## 2020-08-06 LAB — BASIC METABOLIC PANEL
Anion gap: 4 — ABNORMAL LOW (ref 5–15)
BUN: 13 mg/dL (ref 6–20)
CO2: 24 mmol/L (ref 22–32)
Calcium: 9.4 mg/dL (ref 8.9–10.3)
Chloride: 108 mmol/L (ref 98–111)
Creatinine, Ser: 0.84 mg/dL (ref 0.44–1.00)
GFR, Estimated: >60 mL/min (ref >60)
Glucose, Bld: 83 mg/dL (ref 70–99)
Potassium: 4.2 mmol/L (ref 3.5–5.1)
Sodium: 136 mmol/L (ref 135–145)

## 2020-08-06 LAB — CBC WITH DIFFERENTIAL/PLATELET
Abs Immature Granulocytes: 3.41 10*3/uL — ABNORMAL HIGH (ref 0.00–0.07)
Basophils Absolute: 0.1 10*3/uL (ref 0.0–0.1)
Basophils Relative: 0 %
Eosinophils Absolute: 0 10*3/uL (ref 0.0–0.5)
Eosinophils Relative: 0 %
HCT: 25.3 % — ABNORMAL LOW (ref 36.0–46.0)
Hemoglobin: 8.3 g/dL — ABNORMAL LOW (ref 12.0–15.0)
Immature Granulocytes: 14 %
Lymphocytes Relative: 11 %
Lymphs Abs: 2.7 10*3/uL (ref 0.7–4.0)
MCH: 31.7 pg (ref 26.0–34.0)
MCHC: 32.8 g/dL (ref 30.0–36.0)
MCV: 96.6 fL (ref 80.0–100.0)
Monocytes Absolute: 1.4 10*3/uL — ABNORMAL HIGH (ref 0.1–1.0)
Monocytes Relative: 6 %
Neutro Abs: 16.4 10*3/uL — ABNORMAL HIGH (ref 1.7–7.7)
Neutrophils Relative %: 69 %
Platelets: 422 10*3/uL — ABNORMAL HIGH (ref 150–400)
RBC: 2.62 MIL/uL — ABNORMAL LOW (ref 3.87–5.11)
RDW: 14.6 % (ref 11.5–15.5)
Smear Review: NORMAL
WBC: 24.1 10*3/uL — ABNORMAL HIGH (ref 4.0–10.5)
nRBC: 2 % — ABNORMAL HIGH (ref 0.0–0.2)

## 2020-08-06 LAB — BRAIN NATRIURETIC PEPTIDE: B Natriuretic Peptide: 83.9 pg/mL (ref 0.0–100.0)

## 2020-08-06 MED ORDER — DEXAMETHASONE 4 MG PO TABS
4.0000 mg | ORAL_TABLET | Freq: Every day | ORAL | Status: DC
  Administered 2020-08-07: 4 mg via ORAL
  Filled 2020-08-06: qty 1

## 2020-08-06 MED ORDER — FOLIC ACID 1 MG PO TABS
1.0000 mg | ORAL_TABLET | Freq: Every day | ORAL | Status: DC
  Administered 2020-08-07: 1 mg via ORAL
  Filled 2020-08-06: qty 1

## 2020-08-06 NOTE — Progress Notes (Signed)
PHARMACIST - PHYSICIAN COMMUNICATION  Dr:   Florene Glen  CONCERNING: IV to Oral Route Change Policy  RECOMMENDATION: This patient is receiving folic acid by the intravenous route.  Based on criteria approved by the Pharmacy and Therapeutics Committee, the intravenous medication(s) is/are being converted to the equivalent oral dose form(s).   DESCRIPTION: These criteria include: The patient is eating (either orally or via tube) and/or has been taking other orally administered medications for a least 24 hours The patient has no evidence of active gastrointestinal bleeding or impaired GI absorption (gastrectomy, short bowel, patient on TNA or NPO).  If you have questions about this conversion, please contact the Pharmacy Department  []   [phone] )  [facility] [x]   [phone] )  [facility] []   [phone] )  [facility] []   [phone] )  [facility] []   [phone] )  [place], [facility] 08/06/2020 12:48 PM

## 2020-08-06 NOTE — Progress Notes (Signed)
PROGRESS NOTE    Desiree Gaines  YSA:[id number] DOB: [birth year] DOA: 08/02/2020 PCP: Desiree Patch, MD  Chief Complaint  Patient presents with   Oral Swelling   Brief Narrative:  Desiree Gaines is Desiree Gaines 35 y.o. female with medical history significant for Graves' disease, cognitive disability, umbilical hernia who presents to the ED via EMS for evaluation of swelling involving the right side of her face, right tooth abscess draining pus for the past 3 days.  Reported some difficulty swallowing.  In the ED, vital signs stable except for tachycardia, Lab showed white count 18.6, hemoglobin 8.7. Respiratory viral panel is negative. Soft tissue neck CT shows odontogenic infection related to the lower right terminal molar with advanced multi spatial inflammation reaching the upper chest, displacing the tongue, and causing submucosal edema in the right hypopharynx and supraglottic larynx. No abscess.  Patient received IV Decadron and IV Unasyn.  Patient admitted for further management.    Assessment & Plan:   Principal Problem:   Facial cellulitis Active Problems:   Hypothyroidism   Facial cellulitis  Odontogenic Infection Currently afebrile, with leukocytosis (on steroids) Soft tissue neck CT shows odontogenic infection related to the lower right terminal molar with multispatial inflammation reaching upper chest, displacing tongue, and causing submucosal edema in R hypopharynx and supraglottic larynx Repeat CT here 6/30  with improved cellulitis and myositis in R neck, decreased swelling of R pterygoid and masseter muscle.  Improvement in mucosal and submucosal edema in R lateral pharynx.  Periapical lucency around R lower third molar noted with erosion of medial cortex. Case discussed with ENT in ED, recommended steroids/abx Will plan to taper steroids Need to ensure she's able to follow with dental - long discussion with sister today  Continue IV Decadron, IV Unasyn - consider oral  taper tomorrow pending improvement Advance diet as tolerated Patient will need Desiree Gaines dentist appointment scheduled to remove infected tooth   Normocytic anemia  Folate Deficiency  Low Normal B12 Baseline hemoglobin possibly around 9 Labs suggestive of folate and possible b12 deficiency, follow MMA Hypothyroidism likely contributing Replace and follow Daily CBC   Hypothyroidism Last TSH 66, will repeat Improved, continue synthroid   Large umbilical hernia Currently being followed by outpatient surgeons as per patient   Obesity Lifestyle modification advised   LE edema LE [place] without DVT BNP   Cognitive Disability Will need to ensure family support to help follow up at discharge   Estimated body mass index is 33.99 kg/m as calculated from the following:   Height as of this encounter: 5\' 4"  (1.626 m).   Weight as of this encounter: 89.8 kg.    DVT prophylaxis: lovenox Code Status: full  Family Communication: none at bedside Disposition:   Status is: Inpatient  Remains inpatient appropriate because:Inpatient level of care appropriate due to severity of illness  Dispo: The patient is from: Home              Anticipated d/c is to: Home              Patient currently is not medically stable to d/c.   Difficult to place patient No   Consultants:  ENT over phone  Procedures:  none  Antimicrobials:  Anti-infectives (From admission, onward)    Start     Dose/Rate Route Frequency Ordered Stop   08/03/20 0145  Ampicillin-Sulbactam (UNASYN) 3 g in sodium chloride 0.9 % 100 mL IVPB        3 g 200 mL/hr  over 30 Minutes Intravenous Every 6 hours 08/03/20 0056     08/02/20 0515  Ampicillin-Sulbactam (UNASYN) 3 g in sodium chloride 0.9 % 100 mL IVPB        3 g 200 mL/hr over 30 Minutes Intravenous  Once 08/02/20 0502 08/02/20 0545          Subjective: Doesn't feel ready for d/c  Objective: Vitals:   08/06/20 0457 08/06/20 0706 08/06/20 0809 08/06/20 1559  BP: (!)  141/95 (!) 145/104 (!) 151/95 (!) 140/98  Pulse: 79 70 70 70  Resp: 16  18 18   Temp: 98.7 F (37.1 C)  98.5 F (36.9 C) 98.5 F (36.9 C)  TempSrc: Oral  Oral Oral  SpO2: 99% 100% 98% 97%  Weight:      Height:        Intake/Output Summary (Last 24 hours) at 08/06/2020 1719 Last data filed at 08/06/2020 1300 Gross per 24 hour  Intake 240 ml  Output --  Net 240 ml   Filed Weights   08/02/20 0431  Weight: 89.8 kg    Examination:  General: No acute distress. HEENT; submandibular firmness, tenderness on R Cardiovascular: Heart sounds show Desiree Gaines regular rate, and rhythm Lungs: Clear to auscultation bilaterally . Abdomen: Soft, nontender, nondistended  Neurological: Alert and oriented 3. Moves all extremities 4 h. Cranial nerves II through XII grossly intact. Skin: Warm and dry. No rashes or lesions. Extremities: trace edema   Data Reviewed: I have personally reviewed following labs and imaging studies  CBC: Recent Labs  Lab 08/02/20 0445 08/03/20 0526 08/04/20 0539 08/05/20 0423 08/06/20 0518  WBC 18.6* 23.5* 19.9* 19.7* 24.1*  NEUTROABS 16.2*  --  16.8* 15.5* 16.4*  HGB 8.7* 7.4* 7.5* 7.6* 8.3*  HCT 27.6* 23.2* 23.5* 23.6* 25.3*  MCV 98.6 98.3 98.7 97.5 96.6  PLT 372 349 380 400 422*    Basic Metabolic Panel: Recent Labs  Lab 08/02/20 0613 08/03/20 0526 08/04/20 0539 08/05/20 0423 08/06/20 0518  NA 132* 135 138 136 136  K 3.8 4.0 4.7 4.4 4.2  CL 99 106 107 109 108  CO2 28 25 24 23 24   GLUCOSE 101* 107* 108* 114* 83  BUN 8 12 14 12 13   CREATININE 0.76 0.78 0.95 0.80 0.84  CALCIUM 9.8 9.3 9.7 9.2 9.4    GFR: Estimated Creatinine Clearance: 103.3 mL/min (by C-G formula based on SCr of 0.84 mg/dL).  Liver Function Tests: No results for input(s): AST, ALT, ALKPHOS, BILITOT, PROT, ALBUMIN in the last 168 hours.  CBG: No results for input(s): GLUCAP in the last 168 hours.   Recent Results (from the past 240 hour(s))  Resp Panel by RT-PCR (Flu Desiree Gaines&B,  Covid) Nasopharyngeal Swab     Status: None   Collection Time: 08/02/20  6:43 AM   Specimen: Nasopharyngeal Swab; Nasopharyngeal(NP) swabs in vial transport medium  Result Value Ref Range Status   SARS Coronavirus 2 by RT PCR NEGATIVE NEGATIVE Final    Comment: (NOTE) SARS-CoV-2 target nucleic acids are NOT DETECTED.  The SARS-CoV-2 RNA is generally detectable in upper respiratory specimens during the acute phase of infection. The lowest concentration of SARS-CoV-2 viral copies this assay can detect is 138 copies/mL. Dedee Liss negative result does not preclude SARS-Cov-2 infection and should not be used as the sole basis for treatment or other patient management decisions. Laloni Rowton negative result may occur with  improper specimen collection/handling, submission of specimen other than nasopharyngeal swab, presence of viral mutation(s) within the areas targeted by this  assay, and inadequate number of viral copies(<138 copies/mL). Emmilyn Crooke negative result must be combined with clinical observations, patient history, and epidemiological information. The expected result is Negative.  Fact Sheet for Patients:  EntrepreneurPulse.com.au  Fact Sheet for Healthcare Providers:  IncredibleEmployment.be  This test is no t yet approved or cleared by the [place] FDA and  has been authorized for detection and/or diagnosis of SARS-CoV-2 by FDA under an Emergency Use Authorization (EUA). This EUA will remain  in effect (meaning this test can be used) for the duration of the COVID-19 declaration under Section 564(b)(1) of the Act, 21 U.S.C.section 360bbb-3(b)(1), unless the authorization is terminated  or revoked sooner.       Influenza Janat Tabbert by PCR NEGATIVE NEGATIVE Final   Influenza B by PCR NEGATIVE NEGATIVE Final    Comment: (NOTE) The Xpert Xpress SARS-CoV-2/FLU/RSV plus assay is intended as an aid in the diagnosis of influenza from Nasopharyngeal swab specimens and should  not be used as Anae Hams sole basis for treatment. Nasal washings and aspirates are unacceptable for Xpert Xpress SARS-CoV-2/FLU/RSV testing.  Fact Sheet for Patients: EntrepreneurPulse.com.au  Fact Sheet for Healthcare Providers: IncredibleEmployment.be  This test is not yet approved or cleared by the [place] FDA and has been authorized for detection and/or diagnosis of SARS-CoV-2 by FDA under an Emergency Use Authorization (EUA). This EUA will remain in effect (meaning this test can be used) for the duration of the COVID-19 declaration under Section 564(b)(1) of the Act, 21 U.S.C. section 360bbb-3(b)(1), unless the authorization is terminated or revoked.  Performed at [facility], [address], [place], [place] [address]          Radiology Studies: CT SOFT TISSUE NECK W CONTRAST  Result Date: 08/05/2020 CLINICAL DATA:  Odontogenic infection. Right lower tooth infection. Right facial swelling. EXAM: CT NECK WITH CONTRAST TECHNIQUE: Multidetector CT imaging of the neck was performed using the standard protocol following the bolus administration of intravenous contrast. CONTRAST:  22mL OMNIPAQUE IOHEXOL 300 MG/ML  SOLN COMPARISON:  CT neck 08/02/2020 FINDINGS: Pharynx and larynx: Significant improvement in the mucosal and submucosal edema right lateral pharynx compared to the recent CT. No airway compromise. Epiglottis and larynx normal. Salivary glands: No inflammation, mass, or stone. Thyroid: Hypoplastic thyroid. Lymph nodes: No enlarged or necrotic lymph nodes. Vascular: Normal vascular enhancement. Limited intracranial: Negative Visualized orbits: Negative Mastoids and visualized paranasal sinuses: Negative Skeleton: Again noted is periapical lucency around right lower third molar with erosion through the medial cortex compatible with dental infection. Additional periapical lucency around right lower first molar. Caries in the left upper  molar and lower molars. Upper chest: Small pleural effusions. Lung apices otherwise clear. Stranding in the anterior mediastinum appears improved. Aberrant right subclavian artery. Other: Improved soft tissue swelling in the right neck. There remains enlargement of the right masseter muscle and right pterygoid muscle with improvement. There is improved stranding in the subcutaneous fat in the right face. No soft tissue abscess identified. IMPRESSION: Compared with 3 days ago, there is improvement in cellulitis and myositis in the right neck. Decreased swelling of the right pterygoid and masseter muscle. There is improvement in mucosal and submucosal edema in the right lateral pharynx. No airway compromise. Periapical lucency around right lower third molar again noted with erosion of the medial cortex. No soft tissue abscess. Electronically Signed   By: Franchot Gallo M.D.   On: 08/05/2020 18:18   US Venous Img Lower Bilateral (DVT)  Result Date: 08/05/2020 CLINICAL DATA:  35 year old female with lower extremity edema. EXAM: BILATERAL LOWER EXTREMITY VENOUS DOPPLER ULTRASOUND TECHNIQUE: Gray-scale sonography with graded compression, as well as color Doppler and duplex ultrasound were performed to evaluate the lower extremity deep venous systems from the level of the common femoral vein and including the common femoral, femoral, profunda femoral, popliteal and calf veins including the posterior tibial, peroneal and gastrocnemius veins when visible. The superficial great saphenous vein was also interrogated. Spectral Doppler was utilized to evaluate flow at rest and with distal augmentation maneuvers in the common femoral, femoral and popliteal veins. COMPARISON:  None. FINDINGS: RIGHT LOWER EXTREMITY Common Femoral Vein: No evidence of thrombus. Normal compressibility, respiratory phasicity and response to augmentation. Saphenofemoral Junction: No evidence of thrombus. Normal compressibility and flow on color  Doppler imaging. Profunda Femoral Vein: No evidence of thrombus. Normal compressibility and flow on color Doppler imaging. Femoral Vein: No evidence of thrombus. Normal compressibility, respiratory phasicity and response to augmentation. Popliteal Vein: No evidence of thrombus. Normal compressibility, respiratory phasicity and response to augmentation. Calf Veins: No evidence of thrombus. Normal compressibility and flow on color Doppler imaging. Other Findings:  None. LEFT LOWER EXTREMITY Common Femoral Vein: No evidence of thrombus. Normal compressibility, respiratory phasicity and response to augmentation. Saphenofemoral Junction: No evidence of thrombus. Normal compressibility and flow on color Doppler imaging. Profunda Femoral Vein: No evidence of thrombus. Normal compressibility and flow on color Doppler imaging. Femoral Vein: No evidence of thrombus. Normal compressibility, respiratory phasicity and response to augmentation. Popliteal Vein: No evidence of thrombus. Normal compressibility, respiratory phasicity and response to augmentation. Calf Veins: No evidence of thrombus. Normal compressibility and flow on color Doppler imaging. Other Findings:  None. IMPRESSION: No evidence of bilateral lower extremity deep venous thrombosis. Ruthann Cancer, MD [facility] Radiology Electronically Signed   By: Ruthann Cancer MD   On: 08/05/2020 15:10        Scheduled Meds:  dexamethasone (DECADRON) injection  4 mg Intravenous Q12H   enoxaparin (LOVENOX) injection  40 mg Subcutaneous Q24H   [START ON 02/08/2438] folic acid  1 mg Oral Daily   levothyroxine  50 mcg Oral Daily   vitamin B-12  1,000 mcg Oral Daily   Continuous Infusions:  ampicillin-sulbactam (UNASYN) IV 3 g (08/06/20 1642)     LOS: 4 days    Time spent: over 44 min    Fayrene Helper, MD [facility]   To contact the attending provider between 7A-7P or the covering provider during  after hours 7P-7A, please log into the web site [email or web address] and access using universal [place] password for that web site. If you do not have the password, please call the hospital operator.  08/06/2020, 5:19 PM

## 2020-08-06 NOTE — TOC Progression Note (Signed)
Transition of Care [facility]) - Progression Note    Patient Details  Name: Desiree Gaines MRN: [id number] Date of Birth: 03/13/1985  Transition of Care [facility]) CM/SW [place], LCSW Phone Number: 08/06/2020, 1:09 PM  Clinical Narrative:   Printed another copy of Dental Resource List for RN to provide to family at time of discharge. RNCM provided list to patient previously.         Expected Discharge Plan and Services                                                 Social Determinants of Health (SDOH) Interventions    Readmission Risk Interventions No flowsheet data found.

## 2020-08-06 NOTE — Evaluation (Signed)
Physical Therapy Evaluation Patient Details Name: Desiree Gaines MRN: [id number] DOB: Jan 04, 1986 Today's Date: 08/06/2020   History of Present Illness  Pt is a 35 y.o. female presenting to hospital 6/27 with facial swelling.  Pt admitted with facial cellulitis, odontogenic infection related to lower R terminal molar, normocytic anemia, hypothyroidism, and large umbilical hernia.  PMH includes ventral/umbilical hernia, cognitive disability, Graves' disease s/p thyroid ablation, pituitary tumor, thyroid disease, and anemia.  Clinical Impression  Prior to hospital admission, pt was independent with ambulation; lives with her 21 and 92 y.o. children; has assist from family and friends as needed.  Currently pt is modified independent with bed mobility; independent with transfers; and CGA ambulating 360 feet (no AD).  Pt with step through gait pattern; occasional altered stepping pattern initially noted but improved with increased distance ambulating (CGA provided for safety but did not need any physical assist).  No loss of balance with ambulation and head turns R/L/up/down, increasing/decreasing speed, and turning and stopping.  Pt would benefit from skilled PT to address noted impairments and functional limitations (see below for any additional details).  Upon hospital discharge, no further PT needs anticipated.    Follow Up Recommendations No PT follow up    Equipment Recommendations  None recommended by PT    Recommendations for Other Services       Precautions / Restrictions Precautions Precautions: Fall Restrictions Weight Bearing Restrictions: No      Mobility  Bed Mobility Overal bed mobility: Modified Independent             General bed mobility comments: Semi-supine to/from sitting edge of bed without any noted difficulties (HOB elevated)    Transfers Overall transfer level: Independent Equipment used: None             General transfer comment: steady transfers  from bed and from toilet x1 trial each  Ambulation/Gait Ambulation/Gait assistance: Min guard Gait Distance (Feet): 360 Feet Assistive device: None   Gait velocity: mildly decreased   General Gait Details: step through gait pattern; occasional altered stepping pattern initially but improved with increased distance ambulating (CGA provided for safety but did not need any physical assist)  Stairs            Wheelchair Mobility    Modified Rankin (Stroke Patients Only)       Balance Overall balance assessment: Needs assistance Sitting-balance support: No upper extremity supported;Feet supported Sitting balance-Leahy Scale: Normal Sitting balance - Comments: steady sitting reaching outside BOS   Standing balance support: No upper extremity supported;During functional activity Standing balance-Leahy Scale: Good Standing balance comment: no loss of balance with ambulation, toileting tasks, and washing hands at sink               High Level Balance Comments: No loss of balance with ambulation and head turns R/L/up/down, increasing/decreasing speed, and turning and stopping             Pertinent Vitals/Pain Pain Assessment: No/denies pain Vitals (HR and O2 on room air) stable and WFL throughout treatment session.    Home Living Family/patient expects to be discharged to:: Private residence Living Arrangements: Children (9 and 104 y.o.) Available Help at Discharge: Family;Friend(s) Type of Home: Apartment Home Access: Level entry     Home Layout: One level Home Equipment: None      Prior Function Level of Independence: Independent         Comments: Pt reports 1 fall d/t tripping over kids bike but no other  falls in past 6 months.     Hand Dominance        Extremity/Trunk Assessment   Upper Extremity Assessment Upper Extremity Assessment: Generalized weakness    Lower Extremity Assessment Lower Extremity Assessment: Generalized weakness     Cervical / Trunk Assessment Cervical / Trunk Assessment: Normal  Communication   Communication: No difficulties  Cognition Arousal/Alertness: Awake/alert Behavior During Therapy: WFL for tasks assessed/performed Overall Cognitive Status: Within Functional Limits for tasks assessed                                        General Comments      Exercises     Assessment/Plan    PT Assessment Patient needs continued PT services  PT Problem List Decreased strength;Decreased balance;Decreased mobility;Decreased activity tolerance       PT Treatment Interventions DME instruction;Functional mobility training;Therapeutic activities;Therapeutic exercise;Balance training;Gait training;Patient/family education    PT Goals (Current goals can be found in the Care Plan section)  Acute Rehab PT Goals Patient Stated Goal: to go home PT Goal Formulation: With patient Time For Goal Achievement: 08/20/20 Potential to Achieve Goals: Good    Frequency Min 2X/week   Barriers to discharge        Co-evaluation               AM-PAC PT "6 Clicks" Mobility  Outcome Measure Help needed turning from your back to your side while in a flat bed without using bedrails?: None Help needed moving from lying on your back to sitting on the side of a flat bed without using bedrails?: None Help needed moving to and from a bed to a chair (including a wheelchair)?: None Help needed standing up from a chair using your arms (e.g., wheelchair or bedside chair)?: None Help needed to walk in hospital room?: A Little Help needed climbing 3-5 steps with a railing? : A Little 6 Click Score: 22    End of Session Equipment Utilized During Treatment: Gait belt Activity Tolerance: Patient tolerated treatment well Patient left: in bed;with call bell/phone within reach;with bed alarm set;with family/visitor present Nurse Communication: Mobility status;Precautions PT Visit Diagnosis: Muscle  weakness (generalized) (M62.81);Other abnormalities of gait and mobility (R26.89)    Time: 6283-6629 PT Time Calculation (min) (ACUTE ONLY): 35 min   Charges:   PT Evaluation $PT Eval Low Complexity: 1 Low PT Treatments $Therapeutic Exercise: 8-22 mins       Leitha Bleak, PT 08/06/20, 4:34 PM

## 2020-08-06 NOTE — Progress Notes (Signed)
Mobility Specialist - Progress Note   08/06/20 1600  Mobility  Activity Ambulated in hall  Level of Assistance Standby assist, set-up cues, supervision of patient - no hands on  Assistive Device Other (Comment) (IV Pole/Rail)  Distance Ambulated (ft) 950 ft  Mobility Ambulated with assistance in hallway  Mobility Response Tolerated well  Mobility performed by Mobility specialist  $Mobility charge 1 Mobility    Pt motivated for session. Ambulated >900' pushing IV pole. Mild dizziness throughout session. Pt requested to trial ambulation using wall rails only, slightly unsteady. Denied SOB. O2 98%, HR 98 bpm. Pt does state she does not feel she's quite ready for d/c at this time. Pt left in bed with all needs in reach.    Kathee Delton Mobility Specialist 08/06/20, 4:50 PM

## 2020-08-07 LAB — CBC WITH DIFFERENTIAL/PLATELET
Abs Immature Granulocytes: 3.25 10*3/uL — ABNORMAL HIGH (ref 0.00–0.07)
Basophils Absolute: 0.1 10*3/uL (ref 0.0–0.1)
Basophils Relative: 1 %
Eosinophils Absolute: 0 10*3/uL (ref 0.0–0.5)
Eosinophils Relative: 0 %
HCT: 26.3 % — ABNORMAL LOW (ref 36.0–46.0)
Hemoglobin: 8.5 g/dL — ABNORMAL LOW (ref 12.0–15.0)
Immature Granulocytes: 16 %
Lymphocytes Relative: 10 %
Lymphs Abs: 2 10*3/uL (ref 0.7–4.0)
MCH: 31.6 pg (ref 26.0–34.0)
MCHC: 32.3 g/dL (ref 30.0–36.0)
MCV: 97.8 fL (ref 80.0–100.0)
Monocytes Absolute: 1 10*3/uL (ref 0.1–1.0)
Monocytes Relative: 5 %
Neutro Abs: 13.4 10*3/uL — ABNORMAL HIGH (ref 1.7–7.7)
Neutrophils Relative %: 68 %
Platelets: 462 10*3/uL — ABNORMAL HIGH (ref 150–400)
RBC: 2.69 MIL/uL — ABNORMAL LOW (ref 3.87–5.11)
RDW: 14.7 % (ref 11.5–15.5)
Smear Review: NORMAL
WBC: 19.8 10*3/uL — ABNORMAL HIGH (ref 4.0–10.5)
nRBC: 2 % — ABNORMAL HIGH (ref 0.0–0.2)

## 2020-08-07 LAB — COMPREHENSIVE METABOLIC PANEL
ALT: 25 U/L (ref 0–44)
AST: 16 U/L (ref 15–41)
Albumin: 2.9 g/dL — ABNORMAL LOW (ref 3.5–5.0)
Alkaline Phosphatase: 112 U/L (ref 38–126)
Anion gap: 3 — ABNORMAL LOW (ref 5–15)
BUN: 15 mg/dL (ref 6–20)
CO2: 25 mmol/L (ref 22–32)
Calcium: 9.6 mg/dL (ref 8.9–10.3)
Chloride: 108 mmol/L (ref 98–111)
Creatinine, Ser: 0.83 mg/dL (ref 0.44–1.00)
GFR, Estimated: >60 mL/min (ref >60)
Glucose, Bld: 94 mg/dL (ref 70–99)
Potassium: 4.5 mmol/L (ref 3.5–5.1)
Sodium: 136 mmol/L (ref 135–145)
Total Bilirubin: 0.6 mg/dL (ref 0.3–1.2)
Total Protein: 6.9 g/dL (ref 6.5–8.1)

## 2020-08-07 LAB — PHOSPHORUS: Phosphorus: 3.5 mg/dL (ref 2.5–4.6)

## 2020-08-07 LAB — MAGNESIUM: Magnesium: 2.1 mg/dL (ref 1.7–2.4)

## 2020-08-07 MED ORDER — CYANOCOBALAMIN 1000 MCG PO TABS: 1000.0000 ug | ORAL_TABLET | Freq: Every day | ORAL | 0 refills | Status: AC

## 2020-08-07 MED ORDER — DEXAMETHASONE 0.5 MG/5ML PO SOLN: ORAL | 0 refills | Status: AC

## 2020-08-07 MED ORDER — FOLIC ACID 1 MG PO TABS: 1.0000 mg | ORAL_TABLET | Freq: Every day | ORAL | 1 refills | Status: DC

## 2020-08-07 MED ORDER — LEVOTHYROXINE SODIUM 50 MCG PO TABS: 100.0000 ug | ORAL_TABLET | Freq: Every day | ORAL | 2 refills | Status: DC

## 2020-08-07 MED ORDER — AMOXICILLIN-POT CLAVULANATE 400-57 MG/5ML PO SUSR: 875.0000 mg | Freq: Two times a day (BID) | ORAL | 0 refills | Status: DC

## 2020-08-07 NOTE — Plan of Care (Signed)
Continuing with plan of care. 

## 2020-08-07 NOTE — Progress Notes (Signed)
Mobility Specialist - Progress Note   08/07/20 1300  Mobility  Activity Ambulated in hall;Transferred:  Bed to chair  Level of Assistance Independent  Assistive Device None  Distance Ambulated (ft) 650 ft  Mobility Ambulated independently in hallway  Mobility Response Tolerated well  Mobility performed by Mobility specialist  $Mobility charge 1 Mobility    Pt participated in combing hair. Ambulated in hallway independently, pushing IV pole. No dizziness. No LOB. Pt anticipating d/c later this date.    Kathee Delton Mobility Specialist 08/07/20, 1:54 PM

## 2020-08-07 NOTE — Progress Notes (Signed)
Patient's sister, Olivia Mackie, notified that patient is being discharged today.  Olivia Mackie to contact nurse once family member is on the way to get the patient.

## 2020-08-07 NOTE — Discharge Summary (Signed)
Physician Discharge Summary  ELLASON SEGAR XBJ:[id number] DOB: [birth year] DOA: 08/02/2020  PCP: Juline Patch, MD  Admit date: 08/02/2020 Discharge date: 08/07/2020  Time spent: 40 minutes  Recommendations for Outpatient Follow-up:  Follow outpatient CBC/CMP Follow with dentistry outpatient - needs short term follow up, shared importance of this with pt and family - needs definitive treatment of infection with removal of infected tooth Follow on antibiotics and steroid taper Follow on folic acid, H08 - follow pending MMA for low normal B12 Needs outpatient sleep study  Follow repeat thyroid studies outpatient - dose increased at discharge Follow with surgery outpatient for umbilical hernia  Discharge Diagnoses:  Principal Problem:   Facial cellulitis Active Problems:   Hypothyroidism   Discharge Condition: stable  Diet recommendation: heart healthy  Filed Weights   08/02/20 0431  Weight: 89.8 kg    History of present illness:  Desiree Gaines is Bronte Sabado 35 y.o. female with medical history significant for Graves' disease, cognitive disability, umbilical hernia who presents to the ED via EMS for evaluation of swelling involving the right side of her face, right tooth abscess draining pus for the past 3 days.  Reported some difficulty swallowing.  In the ED, vital signs stable except for tachycardia, Lab showed white count 18.6, hemoglobin 8.7. Respiratory viral panel is negative. Soft tissue neck CT shows odontogenic infection related to the lower right terminal molar with advanced multi spatial inflammation reaching the upper chest, displacing the tongue, and causing submucosal edema in the right hypopharynx and supraglottic larynx. No abscess.  Patient received IV Decadron and IV Unasyn.  Patient admitted for further management.    She was admitted for odontogenic infection with extensive surrounding infection/swelling.  Imaging at presentation showed odontogenic infection  related to lower right terminal molar with multispatial inflammation reaching the upper chest, displacing tongue and causing submucosal edema in the R hypopharynx and supraglottic larynx.  She's improved after abx and steroids with repeat imaging 6/30 showing improving cellulitis and myositis in R neck, decreased swelling of R pyerygoid and masseter muscle and improvement in mucosal and submucosal edema inR lateral pharynx.  She's stable for discharge on 7/2, discussed return precautions and importance of dental follow up.   Hospital Course:  Facial cellulitis  Odontogenic Infection Currently afebrile, with leukocytosis (on steroids) Soft tissue neck CT shows odontogenic infection related to the lower right terminal molar with multispatial inflammation reaching upper chest, displacing tongue, and causing submucosal edema in R hypopharynx and supraglottic larynx Repeat CT here 6/30  with improved cellulitis and myositis in R neck, decreased swelling of R pterygoid and masseter muscle.  Improvement in mucosal and submucosal edema in R lateral pharynx.  Periapical lucency around R lower third molar noted with erosion of medial cortex. Case discussed with ENT in ED, recommended steroids/abx Will plan to taper steroids Need to ensure she's able to follow with dental - discussion with sister on day of discharge Continue IV Decadron, IV Unasyn - d/c with oral steroid taper Discharge with augmentin, dexamethasone taper Return precautions Advance diet as tolerated She's tolerating diet, unlabored resp status without stridor or increased WOB - discussed imaging again with ENT, will plan for outpatient follow up  Patient will need Amna Welker dentist appointment scheduled to remove infected tooth - given dental resources, shared with family    Normocytic anemia  Folate Deficiency  Low Normal B12 Baseline hemoglobin possibly around 9 Labs suggestive of folate and possible b12 deficiency, follow MMA Hypothyroidism  likely contributing Send  with both folate and b12 supplementation -> follow MMA outpatient  Daily CBC   Hypothyroidism Last TSH 66, will repeat TSH 27 here Increase synthroid to 100 mcg daily (may need further uptitration, 1.6 mcg/kg dose would be about 150 mcg daily, though as on 50 mcg before, will increase incrementally Improved, continue synthroid   Large umbilical hernia Currently being followed by outpatient surgeons as per patient   Obesity Lifestyle modification advised   LE edema LE [place] without DVT BNP wnl Trace on exam   Cognitive Disability Unclear diagnosis, but does seem like she needs additional support.  Will need to ensure family support to help follow up at discharge - discussed with sister, [name] and emphasized need for dental follow up, compliance with meds.  Concern for OSA Needs sleep study outpatient   Procedures: LE US IMPRESSION: No evidence of bilateral lower extremity deep venous thrombosis.  Consultations: ENT over phone  Discharge Exam: Vitals:   08/07/20 0433 08/07/20 0814  BP: (!) 145/87 130/77  Pulse: 82 62  Resp: 20 16  Temp: 98.4 F (36.9 C) 97.6 F (36.4 C)  SpO2: 100% 100%   She's up for plan for discharge for today Still concerned about the tooth and swelling that she has We discussed importance of follow up for her Discussed with sister, [name] as well  General: No acute distress. HEENT: right sided submandibular firmness/swelling/TTP, no fluctuance noted Cardiovascular: Heart sounds show Amarrion Pastorino regular rate, and rhythm. Lungs: Clear to auscultation bilaterally  Abdomen: Soft, nontender, nondistended  Neurological: Alert and oriented 3. Moves all extremities 4. Cranial nerves II through XII grossly intact. Skin: Warm and dry. No rashes or lesions. Extremities: No clubbing or cyanosis. trace edema.   Discharge Instructions   Discharge Instructions     Call MD for:  difficulty breathing, headache or visual disturbances    Complete by: As directed    Call MD for:  extreme fatigue   Complete by: As directed    Call MD for:  hives   Complete by: As directed    Call MD for:  persistant dizziness or light-headedness   Complete by: As directed    Call MD for:  persistant nausea and vomiting   Complete by: As directed    Call MD for:  redness, tenderness, or signs of infection (pain, swelling, redness, odor or green/yellow discharge around incision site)   Complete by: As directed    Call MD for:  severe uncontrolled pain   Complete by: As directed    Call MD for:  temperature >100.4   Complete by: As directed    Diet - low sodium heart healthy   Complete by: As directed    Discharge instructions   Complete by: As directed    You were seen for an extensive infection related to an infected right lower molar (right lower 3rd molar).  This caused cellulitis and myositis (inflammation in the skin, muscles, and other surrounding tissues) which has improved on imaging with antibiotics and steroids.    We're sending you home with antibiotics and steroids.  The most important thing at discharge is that you follow up with O[facility] Lupe dentist or oral surgeon promptly.  Unfortunately, there's not dentistry available in the hospital.  Take 875 mg augmentin twice daily for 14 days (follow up with dental and PCP regarding adjustments in dose and/or duration of medicines based on the timing of your procedure).  Please call the [facility] for an appointment (you can also  walk in for emergency walk in dental care on [address] first come first serve basis).  Their number is [phone] and located at [address] in [place], [place] (open from 8 am-5 PM M-F).  Another potential resource is the [facility] in [place], located on [address]  Their number is [phone] and they're open from 8-5 M-Th and 8-1 on F.    If you develop worsening swelling, shortness of breath, or difficulty eating or drinking,  return immediately to the ED for evaluation.   In addition to your dental issues, we'll send you with adjusted dose of synthroid for your thyroid function.  You also have folate deficiency, which will send you with folate.  Your B12 was borderline, we sent an MMA which is pending at the time of discharge.  Follow up with surgery outpatient for your hernia.  I think you probably need Tashan Kreitzer sleep study as an outpatient.   Return for new, recurrent, or worsening symptoms.   Please ask your PCP to request records from this hospitalization so they know what was done and what the next steps will be.   Increase activity slowly   Complete by: As directed       Allergies as of 08/07/2020   No Known Allergies      Medication List     TAKE these medications    amoxicillin-clavulanate 400-57 MG/5ML suspension Commonly known as: AUGMENTIN Take 10.9 mLs (875 mg total) by mouth 2 (two) times daily for 14 days.   cyanocobalamin 1000 MCG tablet Take 1 tablet (1,000 mcg total) by mouth daily. Start taking on: August 08, 2020   dexamethasone 0.5 MG/5ML solution Commonly known as: DECADRON Take 40 mLs (4 mg total) by mouth daily for 3 days, THEN 20 mLs (2 mg total) daily for 3 days, THEN 10 mLs (1 mg total) daily for 3 days. Start taking on: August 07, 760   folic acid 1 MG tablet Commonly known as: FOLVITE Take 1 tablet (1 mg total) by mouth daily. Start taking on: August 08, 2020   levothyroxine 50 MCG tablet Commonly known as: SYNTHROID Take 2 tablets (100 mcg total) by mouth daily. Follow up with your PCP for repeat labs What changed:  how much to take additional instructions       No Known Allergies    The results of significant diagnostics from this hospitalization (including imaging, microbiology, ancillary and laboratory) are listed below for reference.    Significant Diagnostic Studies: CT SOFT TISSUE NECK W CONTRAST  Result Date: 08/05/2020 CLINICAL DATA:  Odontogenic  infection. Right lower tooth infection. Right facial swelling. EXAM: CT NECK WITH CONTRAST TECHNIQUE: Multidetector CT imaging of the neck was performed using the standard protocol following the bolus administration of intravenous contrast. CONTRAST:  72mL OMNIPAQUE IOHEXOL 300 MG/ML  SOLN COMPARISON:  CT neck 08/02/2020 FINDINGS: Pharynx and larynx: Significant improvement in the mucosal and submucosal edema right lateral pharynx compared to the recent CT. No airway compromise. Epiglottis and larynx normal. Salivary glands: No inflammation, mass, or stone. Thyroid: Hypoplastic thyroid. Lymph nodes: No enlarged or necrotic lymph nodes. Vascular: Normal vascular enhancement. Limited intracranial: Negative Visualized orbits: Negative Mastoids and visualized paranasal sinuses: Negative Skeleton: Again noted is periapical lucency around right lower third molar with erosion through the medial cortex compatible with dental infection. Additional periapical lucency around right lower first molar. Caries in the left upper molar and lower molars. Upper chest: Small pleural effusions. Lung apices otherwise clear. Stranding  in the anterior mediastinum appears improved. Aberrant right subclavian artery. Other: Improved soft tissue swelling in the right neck. There remains enlargement of the right masseter muscle and right pterygoid muscle with improvement. There is improved stranding in the subcutaneous fat in the right face. No soft tissue abscess identified. IMPRESSION: Compared with 3 days ago, there is improvement in cellulitis and myositis in the right neck. Decreased swelling of the right pterygoid and masseter muscle. There is improvement in mucosal and submucosal edema in the right lateral pharynx. No airway compromise. Periapical lucency around right lower third molar again noted with erosion of the medial cortex. No soft tissue abscess. Electronically Signed   By: Franchot Gallo M.D.   On: 08/05/2020 18:18   CT SOFT  TISSUE NECK W CONTRAST  Result Date: 08/02/2020 CLINICAL DATA:  Abscessed tooth with jaw swelling EXAM: CT NECK WITH CONTRAST TECHNIQUE: Multidetector CT imaging of the neck was performed using the standard protocol following the bolus administration of intravenous contrast. CONTRAST:  14mL OMNIPAQUE IOHEXOL 300 MG/ML  SOLN COMPARISON:  None. FINDINGS: Pharynx and larynx: Secondary inflammation of fact Ng the right pharynx and supraglottic larynx with submucosal edema and mucosal thickening. Salivary glands: Secondary inflammation around the right parotid and submandibular glands Thyroid: Subjectively small gland. Lymph nodes: Expected adenitis on the right more than left, without cavitation. Vascular: Major vascular structures are patent. Extrinsic narrowing of the right IJ. Limited intracranial: Negative Visualized orbits: Negative Mastoids and visualized paranasal sinuses: Partial mastoid opacification on the right more than left Skeleton: The lower right terminal molar, with horizontal lie, is devitalized with medial alveolar dehiscence. Severe regional cellulitis affecting the masticator compartment, cheek, and right submandibular space. Upper chest: Soft tissue structure in the anterior mediastinum correlates is most likely thymus. Subcutaneous stranding does continue into the subcutaneous upper chest in the midline. IMPRESSION: Odontogenic infection related to the lower right terminal molar with advanced multi spatial inflammation reaching the upper chest, displacing the tongue, and causing submucosal edema in the right hypopharynx and supraglottic larynx. No abscess. Electronically Signed   By: Monte Fantasia M.D.   On: 08/02/2020 06:27   US Venous Img Lower Bilateral (DVT)  Result Date: 08/05/2020 CLINICAL DATA:  35 year old female with lower extremity edema. EXAM: BILATERAL LOWER EXTREMITY VENOUS DOPPLER ULTRASOUND TECHNIQUE: Gray-scale sonography with graded compression, as well as color Doppler  and duplex ultrasound were performed to evaluate the lower extremity deep venous systems from the level of the common femoral vein and including the common femoral, femoral, profunda femoral, popliteal and calf veins including the posterior tibial, peroneal and gastrocnemius veins when visible. The superficial great saphenous vein was also interrogated. Spectral Doppler was utilized to evaluate flow at rest and with distal augmentation maneuvers in the common femoral, femoral and popliteal veins. COMPARISON:  None. FINDINGS: RIGHT LOWER EXTREMITY Common Femoral Vein: No evidence of thrombus. Normal compressibility, respiratory phasicity and response to augmentation. Saphenofemoral Junction: No evidence of thrombus. Normal compressibility and flow on color Doppler imaging. Profunda Femoral Vein: No evidence of thrombus. Normal compressibility and flow on color Doppler imaging. Femoral Vein: No evidence of thrombus. Normal compressibility, respiratory phasicity and response to augmentation. Popliteal Vein: No evidence of thrombus. Normal compressibility, respiratory phasicity and response to augmentation. Calf Veins: No evidence of thrombus. Normal compressibility and flow on color Doppler imaging. Other Findings:  None. LEFT LOWER EXTREMITY Common Femoral Vein: No evidence of thrombus. Normal compressibility, respiratory phasicity and response to augmentation. Saphenofemoral Junction: No evidence of thrombus. Normal  compressibility and flow on color Doppler imaging. Profunda Femoral Vein: No evidence of thrombus. Normal compressibility and flow on color Doppler imaging. Femoral Vein: No evidence of thrombus. Normal compressibility, respiratory phasicity and response to augmentation. Popliteal Vein: No evidence of thrombus. Normal compressibility, respiratory phasicity and response to augmentation. Calf Veins: No evidence of thrombus. Normal compressibility and flow on color Doppler imaging. Other Findings:  None.  IMPRESSION: No evidence of bilateral lower extremity deep venous thrombosis. Ruthann Cancer, MD [facility] Electronically Signed   By: Ruthann Cancer MD   On: 08/05/2020 15:10    Microbiology: Recent Results (from the past 240 hour(s))  Resp Panel by RT-PCR (Flu Jonnelle Lawniczak&B, Covid) Nasopharyngeal Swab     Status: None   Collection Time: 08/02/20  6:43 AM   Specimen: Nasopharyngeal Swab; Nasopharyngeal(NP) swabs in vial transport medium  Result Value Ref Range Status   SARS Coronavirus 2 by RT PCR NEGATIVE NEGATIVE Final    Comment: (NOTE) SARS-CoV-2 target nucleic acids are NOT DETECTED.  The SARS-CoV-2 RNA is generally detectable in upper respiratory specimens during the acute phase of infection. The lowest concentration of SARS-CoV-2 viral copies this assay can detect is 138 copies/mL. Marveline Profeta negative result does not preclude SARS-Cov-2 infection and should not be used as the sole basis for treatment or other patient management decisions. Tahirih Lair negative result may occur with  improper specimen collection/handling, submission of specimen other than nasopharyngeal swab, presence of viral mutation(s) within the areas targeted by this assay, and inadequate number of viral copies(<138 copies/mL). Jony Ladnier negative result must be combined with clinical observations, patient history, and epidemiological information. The expected result is Negative.  Fact Sheet for Patients:  EntrepreneurPulse.com.au  Fact Sheet for Healthcare Providers:  IncredibleEmployment.be  This test is no t yet approved or cleared by the [place] FDA and  has been authorized for detection and/or diagnosis of SARS-CoV-2 by FDA under an Emergency Use Authorization (EUA). This EUA will remain  in effect (meaning this test can be used) for the duration of the COVID-19 declaration under Section 564(b)(1) of the Act, 21 U.S.C.section  360bbb-3(b)(1), unless the authorization is terminated  or revoked sooner.       Influenza Joni Norrod by PCR NEGATIVE NEGATIVE Final   Influenza B by PCR NEGATIVE NEGATIVE Final    Comment: (NOTE) The Xpert Xpress SARS-CoV-2/FLU/RSV plus assay is intended as an aid in the diagnosis of influenza from Nasopharyngeal swab specimens and should not be used as Zoiey Christy sole basis for treatment. Nasal washings and aspirates are unacceptable for Xpert Xpress SARS-CoV-2/FLU/RSV testing.  Fact Sheet for Patients: EntrepreneurPulse.com.au  Fact Sheet for Healthcare Providers: IncredibleEmployment.be  This test is not yet approved or cleared by the [place] FDA and has been authorized for detection and/or diagnosis of SARS-CoV-2 by FDA under an Emergency Use Authorization (EUA). This EUA will remain in effect (meaning this test can be used) for the duration of the COVID-19 declaration under Section 564(b)(1) of the Act, 21 U.S.C. section 360bbb-3(b)(1), unless the authorization is terminated or revoked.  Performed at [facility], [address] [place], [place] [address]      Labs: Basic Metabolic Panel: Recent Labs  Lab 08/03/20 0526 08/04/20 0539 08/05/20 0423 08/06/20 0518 08/07/20 0408  NA 135 138 136 136 136  K 4.0 4.7 4.4 4.2 4.5  CL 106 107 109 108 108  CO2 25 24 23 24 25   GLUCOSE 107* 108* 114* 83 94  BUN 12 14  12 13 15   CREATININE 0.78 0.95 0.80 0.84 0.83  CALCIUM 9.3 9.7 9.2 9.4 9.6  MG  --   --   --   --  2.1  PHOS  --   --   --   --  3.5   Liver Function Tests: Recent Labs  Lab 08/07/20 0408  AST 16  ALT 25  ALKPHOS 112  BILITOT 0.6  PROT 6.9  ALBUMIN 2.9*   No results for input(s): LIPASE, AMYLASE in the last 168 hours. No results for input(s): AMMONIA in the last 168 hours. CBC: Recent Labs  Lab 08/02/20 0445 08/03/20 0526 08/04/20 0539 08/05/20 0423 08/06/20 0518 08/07/20 0408  WBC 18.6* 23.5* 19.9*  19.7* 24.1* 19.8*  NEUTROABS 16.2*  --  16.8* 15.5* 16.4* 13.4*  HGB 8.7* 7.4* 7.5* 7.6* 8.3* 8.5*  HCT 27.6* 23.2* 23.5* 23.6* 25.3* 26.3*  MCV 98.6 98.3 98.7 97.5 96.6 97.8  PLT 372 349 380 400 422* 462*   Cardiac Enzymes: No results for input(s): CKTOTAL, CKMB, CKMBINDEX, TROPONINI in the last 168 hours. BNP: BNP (last 3 results) Recent Labs    08/06/20 1800  BNP 83.9    ProBNP (last 3 results) No results for input(s): PROBNP in the last 8760 hours.  CBG: No results for input(s): GLUCAP in the last 168 hours.     Signed:  Fayrene Helper MD.  [facility] 08/07/2020, 12:26 PM

## 2020-08-07 NOTE — Plan of Care (Signed)
Discharge teaching completed with patient and her father, patient is in stable condition.

## 2020-08-08 LAB — METHYLMALONIC ACID, SERUM: Methylmalonic Acid, Quantitative: 103 nmol/L (ref 0–378)

## 2020-08-12 ENCOUNTER — Other Ambulatory Visit: Payer: Self-pay

## 2020-08-12 ENCOUNTER — Ambulatory Visit (INDEPENDENT_AMBULATORY_CARE_PROVIDER_SITE_OTHER): Payer: Medicare Other | Admitting: Surgery

## 2020-08-12 ENCOUNTER — Encounter: Payer: Self-pay | Admitting: Surgery

## 2020-08-12 VITALS — BP 148/93 | Temp 98.0°F | Ht 64.0 in | Wt 187.7 lb

## 2020-08-12 DIAGNOSIS — K439 Ventral hernia without obstruction or gangrene: Secondary | ICD-10-CM

## 2020-08-12 NOTE — Progress Notes (Signed)
Patient ID: Desiree Gaines, female   DOB: 1985-04-06, 35 y.o.   MRN: [id number]  Chief Complaint: Ventral hernia  History of Present Illness MONSERRAT Gaines is a 35 y.o. female with ventral hernia, progressive since 2009 following a pregnancy that resulted in a C-section, reportedly underwent a following pregnancy in 2013.  She reports discomfort from the abdominal bulge.  She denies constipation diarrhea, she denies nausea or vomiting.  She denies abdominal crampy pain.  She presents with [name] her peers support person.  Past Medical History Past Medical History:  Diagnosis Date   Anemia    Graves disease    Pituitary tumor    Thyroid disease    Umbilical hernia       Past Surgical History:  Procedure Laterality Date   CESAREAN SECTION  06/2011   x 2   tubes in ears Bilateral     No Known Allergies  Current Outpatient Medications  Medication Sig Dispense Refill   amoxicillin-clavulanate (AUGMENTIN) 400-57 MG/5ML suspension Take 10.9 mLs (875 mg total) by mouth 2 (two) times daily for 14 days. 305.2 mL 0   dexamethasone (DECADRON) 0.5 MG/5ML solution Take 40 mLs (4 mg total) by mouth daily for 3 days, THEN 20 mLs (2 mg total) daily for 3 days, THEN 10 mLs (1 mg total) daily for 3 days. 100 mL 0   levothyroxine (SYNTHROID) 50 MCG tablet Take 2 tablets (100 mcg total) by mouth daily. Follow up with your PCP for repeat labs 60 tablet 2   vitamin B-12 1000 MCG tablet Take 1 tablet (1,000 mcg total) by mouth daily. 30 tablet 0   No current facility-administered medications for this visit.    Family History Family History  Problem Relation Age of Onset   Diabetes Mother    Hypertension Mother    Stroke Mother    Hypertension Sister    Breast cancer Sister    Heart disease Brother       Social History Social History   Tobacco Use   Smoking status: Never   Smokeless tobacco: Never  Vaping Use   Vaping Use: Never used  Substance Use Topics   Alcohol use: Not  Currently   Drug use: Never        Review of Systems  Constitutional: Negative.   HENT: Negative.    Eyes: Negative.   Respiratory: Negative.    Cardiovascular: Negative.   Gastrointestinal: Negative.   Genitourinary: Negative.   Skin: Negative.   Neurological: Negative.   Psychiatric/Behavioral: Negative.       Physical Exam Blood pressure (!) 148/93, temperature 98 F (36.7 C), height 5\' 4"  (1.626 m), weight 187 lb 11.2 oz (85.1 kg), last menstrual period 08/10/2020. Last Weight  Most recent update: 08/12/2020  9:42 AM    Weight  85.1 kg (187 lb 11.2 oz)             CONSTITUTIONAL: Well developed, and nourished, appropriately responsive and aware without distress.   EYES: Sclera non-icteric.   EARS, NOSE, MOUTH AND THROAT: Mask worn.    Hearing is intact to voice.  NECK: Trachea is midline, and there is no jugular venous distension.  LYMPH NODES:  Lymph nodes in the neck are not enlarged. RESPIRATORY:  Lungs are clear, and breath sounds are equal bilaterally. Normal respiratory effort without pathologic use of accessory muscles. CARDIOVASCULAR: Heart is regular in rate and rhythm. GI: The abdomen is markedly protuberant, I am challenged to identify a clear fascial defect as the  protuberance appears to involve the entire upper abdomen and umbilical area.  She winces with manipulation but has a significant degree of softness and a degree of reducibility that she tolerates.  There may be significant loss of domain at present, there may be a large eventration along with a fascial defect.  Otherwise she is soft, nontender. There were no palpable masses. I could not appreciate hepatosplenomegaly. MUSCULOSKELETAL:  Symmetrical muscle tone apparent in all four extremities.    SKIN: Skin turgor is normal. No pathologic skin lesions appreciated.  NEUROLOGIC:  Motor appears grossly normal.  Cranial nerves are grossly without defect. PSYCH:  Alert and oriented to person, place and time.  Affect is blunted but seemingly appropriate for situation.  Data Reviewed I have personally reviewed what is currently available of the patient's imaging, recent labs and medical records.   Labs:  CBC Latest Ref Rng & Units 08/07/2020 08/06/2020 08/05/2020  WBC 4.0 - 10.5 K/uL 19.8(H) 24.1(H) 19.7(H)  Hemoglobin 12.0 - 15.0 g/dL 8.5(L) 8.3(L) 7.6(L)  Hematocrit 36.0 - 46.0 % 26.3(L) 25.3(L) 23.6(L)  Platelets 150 - 400 K/uL 462(H) 422(H) 400   CMP Latest Ref Rng & Units 08/07/2020 08/06/2020 08/05/2020  Glucose 70 - 99 mg/dL 94 83 114(H)  BUN 6 - 20 mg/dL 15 13 12   Creatinine 0.44 - 1.00 mg/dL 0.83 0.84 0.80  Sodium 135 - 145 mmol/L 136 136 136  Potassium 3.5 - 5.1 mmol/L 4.5 4.2 4.4  Chloride 98 - 111 mmol/L 108 108 109  CO2 22 - 32 mmol/L 25 24 23   Calcium 8.9 - 10.3 mg/dL 9.6 9.4 9.2  Total Protein 6.5 - 8.1 g/dL 6.9 - -  Total Bilirubin 0.3 - 1.2 mg/dL 0.6 - -  Alkaline Phos 38 - 126 U/L 112 - -  AST 15 - 41 U/L 16 - -  ALT 0 - 44 U/L 25 - -      Imaging:  Within last 24 hrs: No results found.  Assessment    Giant ventral hernia/possible associated eventration.  Likely loss of domain. Patient Active Problem List   Diagnosis Date Noted   Ventral hernia without obstruction or gangrene 08/12/2020   Facial cellulitis 08/02/2020   Hypothyroidism 08/02/2020    Plan    We discussed evaluating with a CT scan initially.  Discussed the possibility of an extensive repair that may involve component separation, and the possibility of referral to a hernia center due to the degree of its complexity. As for now we will have her follow-up after the imaging.  Face-to-face time spent with the patient and accompanying care providers(if present) was 30 minutes, with more than 50% of the time spent counseling, educating, and coordinating care of the patient.    These notes generated with voice recognition software. I apologize for typographical errors.  Ronny Bacon M.D., FACS 08/12/2020,  10:07 AM

## 2020-08-12 NOTE — Patient Instructions (Addendum)
We will get you scheduled for a CT scan of your abdomen and pelvis with contrast to better access this hernia.  You are scheduled for a CT scan at [facility] on July 20th at 9:30. You will need to arrive there by 9:15. You will need to have nothing to eat or drink for 4 hours prior. You will need to pick up a prep kit.   We will have you follow up here after we get your results.  See your appointment below.

## 2020-08-17 ENCOUNTER — Telehealth: Payer: Self-pay

## 2020-08-17 NOTE — Telephone Encounter (Signed)
Attempt to reach patient mulitples time to rescheduled referral. Attempts were unssuccesful

## 2020-08-19 ENCOUNTER — Ambulatory Visit (INDEPENDENT_AMBULATORY_CARE_PROVIDER_SITE_OTHER): Payer: Medicare Other | Admitting: Family Medicine

## 2020-08-19 ENCOUNTER — Encounter: Payer: Self-pay | Admitting: Family Medicine

## 2020-08-19 ENCOUNTER — Other Ambulatory Visit: Payer: Self-pay

## 2020-08-19 VITALS — BP 104/62 | HR 60 | Ht 64.0 in | Wt 188.0 lb

## 2020-08-19 DIAGNOSIS — Z01419 Encounter for gynecological examination (general) (routine) without abnormal findings: Secondary | ICD-10-CM

## 2020-08-19 DIAGNOSIS — L03211 Cellulitis of face: Secondary | ICD-10-CM

## 2020-08-19 DIAGNOSIS — K047 Periapical abscess without sinus: Secondary | ICD-10-CM | POA: Diagnosis not present

## 2020-08-19 DIAGNOSIS — Z09 Encounter for follow-up examination after completed treatment for conditions other than malignant neoplasm: Secondary | ICD-10-CM | POA: Diagnosis not present

## 2020-08-19 DIAGNOSIS — E039 Hypothyroidism, unspecified: Secondary | ICD-10-CM | POA: Diagnosis not present

## 2020-08-19 DIAGNOSIS — K439 Ventral hernia without obstruction or gangrene: Secondary | ICD-10-CM | POA: Diagnosis not present

## 2020-08-19 LAB — BASIC METABOLIC PANEL
BUN: 11 (ref 4–21)
Chloride: 104 (ref 99–108)
Creatinine: 0.8 (ref 0.5–1.1)
Glucose: 82
Potassium: 5 (ref 3.4–5.3)
Sodium: 137 (ref 137–147)

## 2020-08-19 LAB — CBC AND DIFFERENTIAL
HCT: 28 — AB (ref 36–46)
Hemoglobin: 9 — AB (ref 12.0–16.0)
Platelets: 258 (ref 150–399)
WBC: 8.1

## 2020-08-19 LAB — COMPREHENSIVE METABOLIC PANEL
Albumin: 4.2 (ref 3.5–5.0)
Calcium: 10.3 (ref 8.7–10.7)

## 2020-08-19 LAB — CBC: RBC: 2.89 — AB (ref 3.87–5.11)

## 2020-08-19 LAB — TSH: TSH: 44.4 — AB (ref 0.41–5.90)

## 2020-08-19 MED ORDER — AMOXICILLIN-POT CLAVULANATE 400-57 MG/5ML PO SUSR: 875.0000 mg | Freq: Two times a day (BID) | ORAL | 0 refills | Status: DC

## 2020-08-19 NOTE — Progress Notes (Signed)
Date:  08/19/2020   Name:  Desiree Gaines   DOB:  12-28-1985   MRN:  [id number]   Chief Complaint: Follow-up (Going to have 4 teeth pulled on July 20th- taking antibiotics in the meantime)  Patient is a 35 year old female who presents for a followup hospital dischargedentition concern exam. The patient reports the following problems: gyn exam/ surgery/ thyroid. Health maintenance has been reviewed in process.     Lab Results  Component Value Date   CREATININE 0.83 08/07/2020   BUN 15 08/07/2020   NA 136 08/07/2020   K 4.5 08/07/2020   CL 108 08/07/2020   CO2 25 08/07/2020   No results found for: CHOL, HDL, LDLCALC, LDLDIRECT, TRIG, CHOLHDL Lab Results  Component Value Date   TSH 27.941 (H) 08/04/2020   No results found for: HGBA1C Lab Results  Component Value Date   WBC 19.8 (H) 08/07/2020   HGB 8.5 (L) 08/07/2020   HCT 26.3 (L) 08/07/2020   MCV 97.8 08/07/2020   PLT 462 (H) 08/07/2020   Lab Results  Component Value Date   ALT 25 08/07/2020   AST 16 08/07/2020   ALKPHOS 112 08/07/2020   BILITOT 0.6 08/07/2020     Review of Systems  Constitutional:  Negative for chills and fever.  HENT:  Negative for drooling, ear discharge, ear pain and sore throat.   Respiratory:  Negative for cough, shortness of breath and wheezing.   Cardiovascular:  Negative for chest pain, palpitations and leg swelling.  Gastrointestinal:  Negative for abdominal pain, blood in stool, constipation, diarrhea and nausea.  Endocrine: Negative for polydipsia.  Genitourinary:  Negative for dysuria, frequency, hematuria and urgency.  Musculoskeletal:  Negative for back pain, myalgias and neck pain.  Skin:  Negative for rash.  Allergic/Immunologic: Negative for environmental allergies.  Neurological:  Negative for dizziness and headaches.  Hematological:  Does not bruise/bleed easily.  Psychiatric/Behavioral:  Negative for suicidal ideas. The patient is not nervous/anxious.    Patient  Active Problem List   Diagnosis Date Noted   Ventral hernia without obstruction or gangrene 08/12/2020   Facial cellulitis 08/02/2020   Hypothyroidism 08/02/2020    No Known Allergies  Past Surgical History:  Procedure Laterality Date   CESAREAN SECTION  06/2011   x 2   tubes in ears Bilateral     Social History   Tobacco Use   Smoking status: Never   Smokeless tobacco: Never  Vaping Use   Vaping Use: Never used  Substance Use Topics   Alcohol use: Not Currently   Drug use: Never     Medication list has been reviewed and updated.  Current Meds  Medication Sig   amoxicillin-clavulanate (AUGMENTIN) 400-57 MG/5ML suspension Take 10.9 mLs (875 mg total) by mouth 2 (two) times daily for 14 days.   dexamethasone 0.5 MG/5ML elixir Take by mouth daily. dentist   levothyroxine (SYNTHROID) 50 MCG tablet Take 2 tablets (100 mcg total) by mouth daily. Follow up with your PCP for repeat labs   vitamin B-12 1000 MCG tablet Take 1 tablet (1,000 mcg total) by mouth daily.    PHQ 2/9 Scores 08/19/2020 07/12/2020  PHQ - 2 Score 0 4  PHQ- 9 Score 0 12    GAD 7 : Generalized Anxiety Score 08/19/2020 07/12/2020  Nervous, Anxious, on Edge 0 3  Control/stop worrying 0 2  Worry too much - different things 0 2  Trouble relaxing 0 1  Restless 0 1  Easily annoyed  or irritable 0 1  Afraid - awful might happen 0 1  Total GAD 7 Score 0 11  Anxiety Difficulty - Not difficult at all    BP Readings from Last 3 Encounters:  08/19/20 104/62  08/12/20 (!) 148/93  08/07/20 130/77    Physical Exam Vitals and nursing note reviewed.  Constitutional:      General: She is not in acute distress.    Appearance: She is not diaphoretic.  HENT:     Head: Normocephalic and atraumatic.     Jaw: There is normal jaw occlusion. No tenderness.     Comments: Able to open mouth better    Right Ear: Tympanic membrane, ear canal and external ear normal.     Left Ear: Tympanic membrane, ear canal and  external ear normal.     Nose: Nose normal. No congestion or rhinorrhea.     Mouth/Throat:     Mouth: Mucous membranes are moist.  Eyes:     General:        Right eye: No discharge.        Left eye: No discharge.     Conjunctiva/sclera: Conjunctivae normal.     Pupils: Pupils are equal, round, and reactive to light.  Neck:     Thyroid: No thyroid mass, thyromegaly or thyroid tenderness.     Vascular: No JVD.  Cardiovascular:     Rate and Rhythm: Normal rate and regular rhythm.     Heart sounds: Normal heart sounds. No murmur heard.   No friction rub. No gallop.  Pulmonary:     Effort: Pulmonary effort is normal.     Breath sounds: Normal breath sounds. No wheezing or rhonchi.  Abdominal:     General: Bowel sounds are normal.     Palpations: Abdomen is soft. There is no mass.     Tenderness: There is no abdominal tenderness. There is no guarding.  Musculoskeletal:        General: Normal range of motion.     Cervical back: Normal range of motion and neck supple. No rigidity.  Lymphadenopathy:     Cervical: No cervical adenopathy.     Right cervical: No superficial, deep or posterior cervical adenopathy.    Left cervical: No superficial, deep or posterior cervical adenopathy.  Skin:    General: Skin is warm and dry.  Neurological:     Mental Status: She is alert.     Deep Tendon Reflexes: Reflexes are normal and symmetric.    Wt Readings from Last 3 Encounters:  08/19/20 188 lb (85.3 kg)  08/12/20 187 lb 11.2 oz (85.1 kg)  08/02/20 198 lb (89.8 kg)    BP 104/62   Pulse 60   Ht 5\' 4"  (1.626 m)   Wt 188 lb (85.3 kg)   LMP 08/10/2020 (Exact Date)   BMI 32.27 kg/m   Assessment and Plan:  1. Hospital discharge follow-up Somewhat of a recheck from her hospital discharge although this is primarily been done by her dentist/oral surgeon that she is going to be seen in the future.  Patient is doing well and the discussion at this time is whether or not to continue her  Augmentin until surgery which we will and follow-up other medical concerns.  2. Facial cellulitis Chronic.  Recently exacerbated.  Patient is doing extremely well with decrease in swelling of her neck facial area cellulitis has resolved since initiation of Augmentin.  We will continue Augmentin until the day before surgery. - amoxicillin-clavulanate (AUGMENTIN) 400-57  MG/5ML suspension; Take 10.9 mLs (875 mg total) by mouth 2 (two) times daily for 14 days.  Dispense: 305.2 mL; Refill: 0  3. Dental abscess Chronic.  Controlled.  Stable.  Will check CBC and renal function for upcoming surgical concerns. - amoxicillin-clavulanate (AUGMENTIN) 400-57 MG/5ML suspension; Take 10.9 mLs (875 mg total) by mouth 2 (two) times daily for 14 days.  Dispense: 305.2 mL; Refill: 0 - CBC with Differential/Platelet - Renal Function Panel  4. Ventral hernia without obstruction or gangrene Chronic.  Persistent.  Relatively stable.  But complicated.  Patient is followed by Dr. Milas Gain and we are in the process of getting a CT scan to evaluate the complexity of the ventral hernia and then determine in which direction and to what extent surgery is going to be anticipated.  5. Encounter for routine gynecologic examination in Medicare patient Patient was unable to schedule with [facility] because either she did not get the appointments or transportation was not available.  We have discussed this with her and with [facility] and patient will be returning call to be set up for GYN exam.  6. Acquired hypothyroidism Patient has hypothyroidism because she has a history of Graves' disease.  She has had a recent increase in her levothyroxine and we will check a thyroid panel with TSH.  Patient is currently on 100 mcg levothyroxine. - Thyroid Panel With TSH   7.  Patient has a history of anemia for which she was put on folic acid and O03 and we will continue this for a little longer before checking her MMA for low normal  B12.

## 2020-08-25 ENCOUNTER — Other Ambulatory Visit: Payer: Self-pay

## 2020-08-25 ENCOUNTER — Ambulatory Visit
Admission: RE | Admit: 2020-08-25 | Discharge: 2020-08-25 | Disposition: A | Discharge status: Home / Self Care | Payer: Medicare Other | Source: Ambulatory Visit | Attending: Surgery | Admitting: Surgery

## 2020-08-25 DIAGNOSIS — K6389 Other specified diseases of intestine: Secondary | ICD-10-CM | POA: Diagnosis not present

## 2020-08-25 DIAGNOSIS — K439 Ventral hernia without obstruction or gangrene: Secondary | ICD-10-CM | POA: Diagnosis not present

## 2020-08-25 DIAGNOSIS — K5939 Other megacolon: Secondary | ICD-10-CM | POA: Diagnosis not present

## 2020-08-25 MED ORDER — IOHEXOL 350 MG/ML SOLN
100.0000 mL | Freq: Once | INTRAVENOUS | Status: AC | PRN
  Administered 2020-08-25: 100 mL via INTRAVENOUS

## 2020-08-27 NOTE — Progress Notes (Unsigned)
Entered labs

## 2020-09-02 ENCOUNTER — Other Ambulatory Visit: Payer: Self-pay

## 2020-09-02 ENCOUNTER — Ambulatory Visit (INDEPENDENT_AMBULATORY_CARE_PROVIDER_SITE_OTHER): Payer: Medicare Other | Admitting: Surgery

## 2020-09-02 ENCOUNTER — Encounter: Payer: Self-pay | Admitting: Surgery

## 2020-09-02 VITALS — BP 127/87 | HR 89 | Temp 98.3°F | Ht 64.0 in | Wt 188.6 lb

## 2020-09-02 DIAGNOSIS — K562 Volvulus: Secondary | ICD-10-CM | POA: Insufficient documentation

## 2020-09-02 DIAGNOSIS — Z1211 Encounter for screening for malignant neoplasm of colon: Secondary | ICD-10-CM | POA: Diagnosis not present

## 2020-09-02 DIAGNOSIS — K5909 Other constipation: Secondary | ICD-10-CM | POA: Diagnosis not present

## 2020-09-02 HISTORY — DX: Volvulus: K56.2

## 2020-09-02 NOTE — Patient Instructions (Addendum)
Referral to [facility] has been sent. Someone from their office will call to schedule an appointment.  Please call our office if you have any questions or concerns.   Diastasis Recti  Diastasis recti is a condition in which the muscles of the abdomen (rectus abdominis muscles) become thin and separate. The result is a wider space between the muscles of the right and left abdomen (abdominal muscles). This wider space between the muscles may cause a bulge in the middle of the abdomen. This bulge may be noticed when a person is straining or when he or shesits up after lying down. Diastasis recti can affect men and women. It is most common among pregnant women, babies, people with obesity, and people who have had abdominal surgery.Exercise or surgery may help correct this condition. What are the causes? Common causes of this condition include: Pregnancy. As the uterus grows in size, it puts pressure on the abdominal muscles, causing the muscles to separate. Obesity. Excess fat puts pressure on abdominal muscles. Weight lifting. Some exercises of the abdomen. Advanced age. Genetics. Having had surgery on the abdomen before. What increases the risk? This condition is more likely to develop in: Women. Newborns, especially newborns who are born early (prematurely). What are the signs or symptoms? Common symptoms of this condition include: A bulge in the middle of your abdomen. You will notice it most when you sit up or strain. Pain in your low back, hips, or the area between your hip bones (pelvis). Constipation. Being unable to control when you urinate (urinary incontinence). Bloating. Poor posture. How is this diagnosed? This condition is diagnosed with a physical exam. During the exam, your health care provider will ask you to lie flat on your back and do a crunch or half sit-up. If you have diastasis recti, a bulge will appear lengthwise between your abdominal muscles in the  center of your abdomen. Your health care provider will measure the gap between your muscles with one of the following: A medical device used to measure the space between two objects (caliper). A tape measure. CT scan. Ultrasound. Finger spaces. Your health care provider will measure the space using his or her fingers. How is this treated? If your muscle separation is not too large, you may not need treatment. However, if you are a woman who plans to become pregnant again, you should treat this condition before your next pregnancy. Treatment may include: Physical therapy exercises to strengthen and tighten your abdominal muscles. Lifestyle changes such as weight loss and exercise. Over-the-counter pain medicines as needed. Surgery to correct the separation. Follow these instructions at home: Activity Return to your normal activities as told by your health care provider. Ask your health care provider what activities are safe for you. Do exercises as told by your health care provider. Make sure you are doing your exercises and movements correctly when lifting weights or doing exercises using your abdominal muscles or the muscles in the center of your body that give stability (core muscles). Proper form can help to prevent this condition from happening again. General instructions If you are overweight, ask your health care provider for help with weight loss. Losing even a small amount of weight can help to improve your diastasis recti. Take over-the-counter or prescription medicines only as told by your health care provider. Do not strain. Straining can make the separation worse. Examples of straining include: Pushing hard to have a bowel movement, such as when you have constipation. Lifting heavy objects or  lifting children. Standing up and sitting down. You may need to take these actions to prevent or treat constipation: Drink enough fluid to keep your urine pale yellow. Take over-the-counter  or prescription medicines. Eat foods that are high in fiber, such as beans, whole grains, and fresh fruits and vegetables. Limit foods that are high in fat and processed sugars, such as fried or sweet foods. Keep all follow-up visits. This is important. Contact a health care provider if: You notice a new bulge in your abdomen. Get help right away if: You experience severe discomfort in your abdomen. You develop severe abdominal pain along with nausea, vomiting, or a fever. Summary Diastasis recti is a condition in which the muscles of the abdomen (rectus abdominismuscles) become thin and separate. You may notice a bulge in your abdomen because the space has widened between the muscles of the right and left abdomen. The most common symptom is a bulge in the middle of your abdomen. You will notice it most when you sit up or strain. This condition is diagnosed with a physical exam. If the muscle separation is not too big, you may not need treatment. Otherwise, you may need to do physical therapy or have surgery. This information is not intended to replace advice given to you by your health care provider. Make sure you discuss any questions you have with your healthcare provider. Document Revised: 09/26/2019 Document Reviewed: 09/26/2019 Elsevier Patient Education  [place].

## 2020-09-02 NOTE — Progress Notes (Signed)
Patient ID: Desiree Gaines, female   DOB: Sep 20, 1985, 35 y.o.   MRN: [id number]  Chief Complaint: Follow-up presumed ventral hernia  History of Present Illness Desiree Gaines is a 35 y.o. female with presumed/reported ventral hernia, progressive abdominal protuberance since 2009 following a pregnancy that resulted in a C-section, reportedly underwent a following pregnancy in 2013.  She reports discomfort from the abdominal bulge.  She denies constipation, though she admits to only having 1 bowel movement every other day.  She denies diarrhea, she denies nausea or vomiting.  She denies abdominal crampy pain.  She presents with [name] her peers support person.  She returns in follow-up for her CT scan results which confirmed the absence of any fascial defect, confirming a diastases/abdominal eventration.  She does have a redundant sigmoid with dilatation and gas without evidence of obstruction or torsion, she may be at risk for volvulus.  But currently she is asymptomatic. We got on the phone to discuss this with [name] her sister at [id number].  Explained to her the lack of surgical indications for her sisters abdominal protuberance, and our game plan to further evaluate her colon with elective colonoscopy and GI referral.    Past Medical History Past Medical History:  Diagnosis Date   Anemia    Graves disease    Pituitary tumor    Thyroid disease    Umbilical hernia       Past Surgical History:  Procedure Laterality Date   CESAREAN SECTION  06/2011   x 2   tubes in ears Bilateral     No Known Allergies  Current Outpatient Medications  Medication Sig Dispense Refill   folic acid (FOLVITE) 1 MG tablet Take 1 mg by mouth daily.     levothyroxine (SYNTHROID) 50 MCG tablet Take 2 tablets (100 mcg total) by mouth daily. Follow up with your PCP for repeat labs 60 tablet 2   vitamin B-12 1000 MCG tablet Take 1 tablet (1,000 mcg total) by mouth daily. 30 tablet 0   No current  facility-administered medications for this visit.    Family History Family History  Problem Relation Age of Onset   Diabetes Mother    Hypertension Mother    Stroke Mother    Hypertension Sister    Breast cancer Sister    Heart disease Brother       Social History Social History   Tobacco Use   Smoking status: Never   Smokeless tobacco: Never  Vaping Use   Vaping Use: Never used  Substance Use Topics   Alcohol use: Not Currently   Drug use: Never        Review of Systems  Constitutional: Negative.   HENT: Negative.    Eyes: Negative.   Respiratory: Negative.    Cardiovascular: Negative.   Gastrointestinal: Negative.   Genitourinary: Negative.   Skin: Negative.   Neurological: Negative.   Psychiatric/Behavioral: Negative.       Physical Exam Blood pressure 127/87, pulse 89, temperature 98.3 F (36.8 C), temperature source Oral, height '5\' 4"'$  (1.626 m), weight 188 lb 9.6 oz (85.5 kg), last menstrual period 08/10/2020, SpO2 97 %. Last Weight  Most recent update: 09/02/2020  9:04 AM    Weight  85.5 kg (188 lb 9.6 oz)             CONSTITUTIONAL: Well developed, and nourished, appropriately responsive and aware without distress.   EYES: Sclera non-icteric.   EARS, NOSE, MOUTH AND THROAT: Mask worn.    Hearing  is intact to voice.  NECK: Trachea is midline, and there is no jugular venous distension.  LYMPH NODES:  Lymph nodes in the neck are not enlarged. RESPIRATORY:  Lungs are clear, and breath sounds are equal bilaterally. Normal respiratory effort without pathologic use of accessory muscles. CARDIOVASCULAR: Heart is regular in rate and rhythm. GI: The abdomen is markedly protuberant, I never could identify a clear fascial defect as the protuberance appears to involve the entire upper abdomen.  She remains soft.  There may be significant loss of domain at present, this is consistent a large eventration.  Otherwise she is soft, nontender. There were no palpable  masses. MUSCULOSKELETAL:  Symmetrical muscle tone apparent in all four extremities.    SKIN: Skin turgor is normal. No pathologic skin lesions appreciated.  NEUROLOGIC:  Motor appears grossly normal.  Cranial nerves are grossly without defect. PSYCH:  Alert and oriented to person, place and time. Affect is blunted but seemingly appropriate for situation.  Data Reviewed I have personally reviewed what is currently available of the patient's imaging, recent labs and medical records.   Labs:  CBC Latest Ref Rng & Units 08/19/2020 08/07/2020 08/06/2020  WBC - 8.1 19.8(H) 24.1(H)  Hemoglobin 12.0 - 16.0 9.0(A) 8.5(L) 8.3(L)  Hematocrit 36 - 46 28(A) 26.3(L) 25.3(L)  Platelets 150 - 399 258 462(H) 422(H)   CMP Latest Ref Rng & Units 08/19/2020 08/07/2020 08/06/2020  Glucose 70 - 99 mg/dL - 94 83  BUN 4 - '21 11 15 13  '$ Creatinine 0.5 - 1.1 0.8 0.83 0.84  Sodium 137 - 147 137 136 136  Potassium 3.4 - 5.3 5.0 4.5 4.2  Chloride 99 - 108 104 108 108  CO2 22 - 32 mmol/L - 25 24  Calcium 8.7 - 10.7 10.3 9.6 9.4  Total Protein 6.5 - 8.1 g/dL - 6.9 -  Total Bilirubin 0.3 - 1.2 mg/dL - 0.6 -  Alkaline Phos 38 - 126 U/L - 112 -  AST 15 - 41 U/L - 16 -  ALT 0 - 44 U/L - 25 -      Imaging: CLINICAL DATA:  Five years of abdominal distension and bloating.   EXAM: CT ABDOMEN AND PELVIS WITH CONTRAST   TECHNIQUE: Multidetector CT imaging of the abdomen and pelvis was performed using the standard protocol following bolus administration of intravenous contrast.   CONTRAST:  142m OMNIPAQUE IOHEXOL 350 MG/ML SOLN   COMPARISON:  None.   FINDINGS: Lower chest: Hypoventilatory change in the dependent lungs. Nodular consolidative focus in the right lower lobe favor infectious or inflammatory.   Hepatobiliary: No suspicious hepatic lesion. Gallbladder is unremarkable. No biliary ductal dilation.   Pancreas: Within normal limits.   Spleen: Within normal limits.   Adrenals/Urinary Tract: Right  adrenal glands unremarkable. Enhancing 13 mm left adrenal nodule. Kidneys are normal, without renal calculi, solid enhancing lesion, or hydronephrosis. Bladder is unremarkable.   Stomach/Bowel: Radiopaque enteric contrast traverses the distal small bowel. Stomach is unremarkable. No pathologic dilation of small bowel. The appendix is not definitely visualized however there is no pericecal inflammation. No suspicious colonic wall thickening or mass like lesions. Sigmoid colon is rotated, best visualized on axial image [id number], with majority of the sigmoid colon present in the anterior upper abdomen superior in location to the transverse colon with dilation of the sigmoid colon measuring up to 7.3 cm. However, there is stool and gas visualized to the level of the rectum. No evidence of sigmoid colonic ischemia.   Vascular/Lymphatic: No  abdominal aortic aneurysm. No pathologically enlarged abdominal or pelvic lymph nodes.   Reproductive: Uterus is within normal limits for patient's age and history of C-section. Bilateral simple appearing ovarian cysts measuring up to 2.1 cm on the left, likely physiologic. No follow-up imaging recommended. Note: This recommendation does not apply to premenarchal patients and to those with increased risk (genetic, family history, elevated tumor markers or other high-risk factors) of ovarian cancer. Reference: JACR 2020 Feb; 17(2):248-254   Other: No abdominopelvic ascites.  Diastasis rectus.   Musculoskeletal: No acute osseous abnormality.   IMPRESSION: 1. Sigmoid colon is rotated and dilated measuring up to 7.3 cm, with the majority of the sigmoid colon present in the anterior upper abdomen, but with stool and gas visualized to the level of the rectum. Findings suggestive of sigmoid volvulus without high-grade obstruction or evidence of ischemia. 2. Diastasis rectus. 3. Indeterminate enhancing 13 mm left adrenal nodule, consider further evaluation  with nonemergent adrenal protocol MRI or CT.     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 This may be exacerbated by chronic distention of the sigmoid colon. 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   Ventral hernia without obstruction or gangrene 08/12/2020   Facial cellulitis 08/02/2020   Hypothyroidism 08/02/2020   Acute posthemorrhagic anemia 07/09/2011   Delivery with history of cesarean section 07/08/2011   Other postprocedural status(V45.89) 07/08/2011   Iron deficiency anemia 03/06/2011   Maternal thyroid dysfunction, antepartum 01/21/2007   Developmental delay 06/19/2000    Plan    Advised utilization of twice daily MiraLAX to encourage at least daily bowel movements, made referral to gastroenterology for elective evaluation of sigmoid colon, in light of suggested volvulus on imaging. Will be glad to follow-up with her should any abdominal pain worsen or should become symptomatic from sigmoid volvulus. Follow-up after GI evaluation. Face-to-face time spent with the patient and accompanying care providers(if present) was 40 minutes, with more than 50% of the time spent counseling, educating, and coordinating care of the patient.   This was discussed with her Sister [name] over the phone.    These notes generated with voice recognition software. I apologize for typographical errors.  Ronny Bacon M.D., FACS 09/02/2020, 10:02 AM

## 2020-09-22 ENCOUNTER — Other Ambulatory Visit: Payer: Self-pay

## 2020-09-22 ENCOUNTER — Ambulatory Visit (INDEPENDENT_AMBULATORY_CARE_PROVIDER_SITE_OTHER): Payer: Medicare Other

## 2020-09-22 VITALS — BP 108/72 | HR 85 | Temp 97.8°F | Resp 16 | Ht 64.0 in | Wt 191.0 lb

## 2020-09-22 DIAGNOSIS — Z59819 Housing instability, housed unspecified: Secondary | ICD-10-CM

## 2020-09-22 DIAGNOSIS — E039 Hypothyroidism, unspecified: Secondary | ICD-10-CM

## 2020-09-22 DIAGNOSIS — F439 Reaction to severe stress, unspecified: Secondary | ICD-10-CM | POA: Diagnosis not present

## 2020-09-22 DIAGNOSIS — Z0000 Encounter for general adult medical examination without abnormal findings: Secondary | ICD-10-CM

## 2020-09-22 DIAGNOSIS — Z599 Problem related to housing and economic circumstances, unspecified: Secondary | ICD-10-CM | POA: Diagnosis not present

## 2020-09-22 DIAGNOSIS — Z789 Other specified health status: Secondary | ICD-10-CM

## 2020-09-22 DIAGNOSIS — Z748 Other problems related to care provider dependency: Secondary | ICD-10-CM

## 2020-09-22 MED ORDER — LEVOTHYROXINE SODIUM 50 MCG PO TABS: 50.0000 ug | ORAL_TABLET | Freq: Every day | ORAL | 0 refills | Status: DC

## 2020-09-22 NOTE — Patient Instructions (Signed)
[name] , Thank you for taking time to come for your Medicare Wellness Visit. I appreciate your ongoing commitment to your health goals. Please review the following plan we discussed and let me know if I can assist you in the future.   Screening recommendations/referrals: Colonoscopy: discuss with Dr. Vicente Males at gastroenterology appt on 11/15/20 Mammogram: due age 35 Bone Density: due age 62 Recommended yearly ophthalmology/optometry visit for glaucoma screening and checkup Recommended yearly dental visit for hygiene and checkup  Vaccinations: Influenza vaccine: due 10/07/20 Pneumococcal vaccine: due for Prevnar20 Tdap vaccine: done 05/01/11 Shingles vaccine: due age 89   Covid-19:declined  Advanced directives: Please bring a copy of your health care power of attorney to the office at your convenience.   Conditions/risks identified:   Please contact [place] to schedule an appointment for routine care at [phone]  Next appointment: Follow up in one year for your annual wellness visit    Preventive Care 65 Years and Older, Female Preventive care refers to lifestyle choices and visits with your health care provider that can promote health and wellness. What does preventive care include? A yearly physical exam. This is also called an annual well check. Dental exams once or twice a year. Routine eye exams. Ask your health care provider how often you should have your eyes checked. Personal lifestyle choices, including: Daily care of your teeth and gums. Regular physical activity. Eating a healthy diet. Avoiding tobacco and drug use. Limiting alcohol use. Practicing safe sex. Taking low-dose aspirin every day. Taking vitamin and mineral supplements as recommended by your health care provider. What happens during an annual well check? The services and screenings done by your health care provider during your annual well check will depend on your age, overall health,  lifestyle risk factors, and family history of disease. Counseling  Your health care provider may ask you questions about your: Alcohol use. Tobacco use. Drug use. Emotional well-being. Home and relationship well-being. Sexual activity. Eating habits. History of falls. Memory and ability to understand (cognition). Work and work Statistician. Reproductive health. Screening  You may have the following tests or measurements: Height, weight, and BMI. Blood pressure. Lipid and cholesterol levels. These may be checked every 5 years, or more frequently if you are over 52 years old. Skin check. Lung cancer screening. You may have this screening every year starting at age 48 if you have a 30-pack-year history of smoking and currently smoke or have quit within the past 15 years. Fecal occult blood test (FOBT) of the stool. You may have this test every year starting at age 84. Flexible sigmoidoscopy or colonoscopy. You may have a sigmoidoscopy every 5 years or a colonoscopy every 10 years starting at age 78. Hepatitis C blood test. Hepatitis B blood test. Sexually transmitted disease (STD) testing. Diabetes screening. This is done by checking your blood sugar (glucose) after you have not eaten for a while (fasting). You may have this done every 1-3 years. Bone density scan. This is done to screen for osteoporosis. You may have this done starting at age 29. Mammogram. This may be done every 1-2 years. Talk to your health care provider about how often you should have regular mammograms. Talk with your health care provider about your test results, treatment options, and if necessary, the need for more tests. Vaccines  Your health care provider may recommend certain vaccines, such as: Influenza vaccine. This is recommended every year. Tetanus, diphtheria, and acellular pertussis (Tdap, Td) vaccine. You may need a  Td booster every 10 years. Zoster vaccine. You may need this after age  90. Pneumococcal 13-valent conjugate (PCV13) vaccine. One dose is recommended after age 16. Pneumococcal polysaccharide (PPSV23) vaccine. One dose is recommended after age 69. Talk to your health care provider about which screenings and vaccines you need and how often you need them. This information is not intended to replace advice given to you by your health care provider. Make sure you discuss any questions you have with your health care provider. Document Released: 02/19/2015 Document Revised: 10/13/2015 Document Reviewed: 11/24/2014 Elsevier Interactive Patient Education  2017 [employer] Prevention in the Home Falls can cause injuries. They can happen to people of all ages. There are many things you can do to make your home safe and to help prevent falls. What can I do on the outside of my home? Regularly fix the edges of walkways and driveways and fix any cracks. Remove anything that might make you trip as you walk through a door, such as a raised step or threshold. Trim any bushes or trees on the path to your home. Use bright outdoor lighting. Clear any walking paths of anything that might make someone trip, such as rocks or tools. Regularly check to see if handrails are loose or broken. Make sure that both sides of any steps have handrails. Any raised decks and porches should have guardrails on the edges. Have any leaves, snow, or ice cleared regularly. Use sand or salt on walking paths during winter. Clean up any spills in your garage right away. This includes oil or grease spills. What can I do in the bathroom? Use night lights. Install grab bars by the toilet and in the tub and shower. Do not use towel bars as grab bars. Use non-skid mats or decals in the tub or shower. If you need to sit down in the shower, use a plastic, non-slip stool. Keep the floor dry. Clean up any water that spills on the floor as soon as it happens. Remove soap buildup in the tub or shower  regularly. Attach bath mats securely with double-sided non-slip rug tape. Do not have throw rugs and other things on the floor that can make you trip. What can I do in the bedroom? Use night lights. Make sure that you have a light by your bed that is easy to reach. Do not use any sheets or blankets that are too big for your bed. They should not hang down onto the floor. Have a firm chair that has side arms. You can use this for support while you get dressed. Do not have throw rugs and other things on the floor that can make you trip. What can I do in the kitchen? Clean up any spills right away. Avoid walking on wet floors. Keep items that you use a lot in easy-to-reach places. If you need to reach something above you, use a strong step stool that has a grab bar. Keep electrical cords out of the way. Do not use floor polish or wax that makes floors slippery. If you must use wax, use non-skid floor wax. Do not have throw rugs and other things on the floor that can make you trip. What can I do with my stairs? Do not leave any items on the stairs. Make sure that there are handrails on both sides of the stairs and use them. Fix handrails that are broken or loose. Make sure that handrails are as long as the stairways. Check any  carpeting to make sure that it is firmly attached to the stairs. Fix any carpet that is loose or worn. Avoid having throw rugs at the top or bottom of the stairs. If you do have throw rugs, attach them to the floor with carpet tape. Make sure that you have a light switch at the top of the stairs and the bottom of the stairs. If you do not have them, ask someone to add them for you. What else can I do to help prevent falls? Wear shoes that: Do not have high heels. Have rubber bottoms. Are comfortable and fit you well. Are closed at the toe. Do not wear sandals. If you use a stepladder: Make sure that it is fully opened. Do not climb a closed stepladder. Make sure that  both sides of the stepladder are locked into place. Ask someone to hold it for you, if possible. Clearly mark and make sure that you can see: Any grab bars or handrails. First and last steps. Where the edge of each step is. Use tools that help you move around (mobility aids) if they are needed. These include: Canes. Walkers. Scooters. Crutches. Turn on the lights when you go into a dark area. Replace any light bulbs as soon as they burn out. Set up your furniture so you have a clear path. Avoid moving your furniture around. If any of your floors are uneven, fix them. If there are any pets around you, be aware of where they are. Review your medicines with your doctor. Some medicines can make you feel dizzy. This can increase your chance of falling. Ask your doctor what other things that you can do to help prevent falls. This information is not intended to replace advice given to you by your health care provider. Make sure you discuss any questions you have with your health care provider. Document Released: 11/19/2008 Document Revised: 07/01/2015 Document Reviewed: 02/27/2014 Elsevier Interactive Patient Education  2017 [employer].

## 2020-09-22 NOTE — Progress Notes (Signed)
Subjective:   Desiree Gaines is a 35 y.o. female who presents for an Initial Medicare Annual Wellness Visit.  Review of Systems     Cardiac Risk Factors include: obesity (BMI >30kg/m2)     Objective:    Today's Vitals   09/22/20 0915  BP: 108/72  Pulse: 85  Resp: 16  Temp: 97.8 F (36.6 C)  TempSrc: Oral  SpO2: 99%  Weight: 191 lb (86.6 kg)  Height: _0  (1.626 m)   Body mass index is 32.79 kg/m.  Advanced Directives 09/22/2020 08/02/2020  Does Patient Have a Medical Advance Directive? Yes No  Type of Advance Directive [place] in Chart? No - copy requested -  Would patient like information on creating a medical advance directive? - No - Patient declined    Current Medications (verified) Outpatient Encounter Medications as of 09/22/2020  Medication Sig   folic acid (FOLVITE) 1 MG tablet Take 1 mg by mouth daily.   [DISCONTINUED] levothyroxine (SYNTHROID) 50 MCG tablet Take 2 tablets (100 mcg total) by mouth daily. Follow up with your PCP for repeat labs   No facility-administered encounter medications on file as of 09/22/2020.    Allergies (verified) Patient has no known allergies.   History: Past Medical History:  Diagnosis Date   Anemia    Graves disease    Pituitary tumor    Thyroid disease    Umbilical hernia    Past Surgical History:  Procedure Laterality Date   CESAREAN SECTION  06/2011   x 2   tubes in ears Bilateral    Family History  Problem Relation Age of Onset   Diabetes Mother    Hypertension Mother    Stroke Mother    Hypertension Sister    Breast cancer Sister    Heart disease Brother    Social History   Socioeconomic History   Marital status: Single    Spouse name: Not on file   Number of children: 2   Years of education: Not on file   Highest education level: High school graduate  Occupational History   Not on file  Tobacco Use   Smoking status: Never    Smokeless tobacco: Never  Vaping Use   Vaping Use: Never used  Substance and Sexual Activity   Alcohol use: Not Currently   Drug use: Never   Sexual activity: Yes    Birth control/protection: None  Other Topics Concern   Not on file  Social History Narrative   Pt has 2 children that live with her   Social Determinants of Radio broadcast assistant Strain: Low Risk    Difficulty of Paying Living Expenses: Not very hard  Food Insecurity: No Food Insecurity   Worried About Charity fundraiser in the Last Year: Never true   Arboriculturist in the Last Year: Never true  Transportation Needs: Unmet Transportation Needs   Lack of Transportation (Medical): Yes   Lack of Transportation (Non-Medical): Yes  Physical Activity: Insufficiently Active   Days of Exercise per Week: 4 days   Minutes of Exercise per Session: 30 min  Stress: Stress Concern Present   Feeling of Stress : Very much  Social Connections: Socially Isolated   Frequency of Communication with Friends and Family: More than three times a week   Frequency of Social Gatherings with Friends and Family: Three times a week   Attends Religious Services: Never  Active Member of Clubs or Organizations: No   Attends Archivist Meetings: Never   Marital Status: Never married    Tobacco Counseling Counseling given: Not Answered   Clinical Intake:                         Activities of Daily Living In your present state of health, do you have any difficulty performing the following activities: 09/22/2020 08/02/2020  Hearing? N N  Vision? N N  Difficulty concentrating or making decisions? Y N  Comment concentration -  Walking or climbing stairs? N Y  Dressing or bathing? N N  Doing errands, shopping? N N  Preparing Food and eating ? N -  Using the Toilet? N -  In the past six months, have you accidently leaked urine? N -  Do you have problems with loss of bowel control? N -  Managing your  Medications? N -  Managing your Finances? N -  Housekeeping or managing your Housekeeping? N -  Some recent data might be hidden    Patient Care Team: Juline Patch, MD as PCP - General (Family Medicine)  Indicate any recent Medical Services you may have received from other than [facility] providers in the past year (date may be approximate).     Assessment:   This is a routine wellness examination for [facility].  Hearing/Vision screen Hearing Screening - Comments:: Pt denies hearing difficulty Vision Screening - Comments:: Past due for eye exam; not established with eye dr &amp; denies vision difficulty  Dietary issues and exercise activities discussed: Current Exercise Habits: Home exercise routine, Type of exercise: walking, Time (Minutes): 30, Frequency (Times/Week): 4, Weekly Exercise (Minutes/Week): 120, Intensity: Mild, Exercise limited by: None identified   Goals Addressed             This Visit's Progress    Increase physical activity       Recommend increasing physical activity to 150 minutes per week        Depression Screen PHQ 2/9 Scores 09/22/2020 08/19/2020 07/12/2020  PHQ - 2 Score 1 0 4  PHQ- 9 Score 6 0 12    Fall Risk Fall Risk  09/22/2020 09/02/2020 08/19/2020 07/12/2020  Falls in the past year? 1 0 0 1  Number falls in past yr: 0 - 0 0  Injury with Fall? 0 - 0 0  Risk for fall due to : History of fall(s) - No Fall Risks -  Follow up Falls prevention discussed - Falls evaluation completed Falls evaluation completed    [place]:  Any stairs in or around the home? No  If so, are there any without handrails? No  Home free of loose throw rugs in walkways, pet beds, electrical cords, etc? Yes  Adequate lighting in your home to reduce risk of falls? No   ASSISTIVE DEVICES UTILIZED TO PREVENT FALLS:  Life alert? No  Use of a cane, walker or w/c? No  Grab bars in the bathroom? No  Shower chair or bench in shower? No   Elevated toilet seat or a handicapped toilet? No   TIMED UP AND GO:  Was the test performed? Yes .  Length of time to ambulate 10 feet: 5 sec.   Gait steady and fast without use of assistive device  Cognitive Function: Cognitive status assessed by direct observation. Patient has current diagnosis of developmental delay.  Immunizations Immunization History  Administered Date(s) Administered   DTaP 03/13/1989, 10/29/1992   HPV Quadrivalent 07/20/2005, 10/25/2005, 02/20/2006   Hepatitis A, Adult 02/20/2006   Hepatitis B, adult 07/20/2005   HiB (PRP-OMP) 07/18/1988   IPV 10/29/1992   Influenza, Seasonal, Injecte, Preservative Fre 02/06/2011   MMR 07/18/1988, 10/29/1992   Meningococcal Conjugate 07/20/2005   Tdap 07/20/2005, 05/01/2011   Varicella 02/20/2006    TDAP status: Up to date  Flu Vaccine status: Due, Education has been provided regarding the importance of this vaccine. Advised may receive this vaccine at local pharmacy or Health Dept. Aware to provide a copy of the vaccination record if obtained from local pharmacy or Health Dept. Verbalized acceptance and understanding.  Pneumococcal vaccine status: Due, Education has been provided regarding the importance of this vaccine. Advised may receive this vaccine at local pharmacy or Health Dept. Aware to provide a copy of the vaccination record if obtained from local pharmacy or Health Dept. Verbalized acceptance and understanding.  Covid-19 vaccine status: Declined, Education has been provided regarding the importance of this vaccine but patient still declined. Advised may receive this vaccine at local pharmacy or Health Dept.or vaccine clinic. Aware to provide a copy of the vaccination record if obtained from local pharmacy or Health Dept. Verbalized acceptance and understanding.  Qualifies for Shingles Vaccine? No  due age 5   Screening Tests Health Maintenance  Topic Date Due   Pneumococcal Vaccine 0-64 Years  old (1 - PCV) Never done   PAP SMEAR-Modifier  Never done   INFLUENZA VACCINE  09/06/2020   TETANUS/TDAP  04/30/2021   HPV VACCINES  Completed   Hepatitis C Screening  Completed   HIV Screening  Completed   COVID-19 Vaccine  Discontinued    Health Maintenance  Health Maintenance Due  Topic Date Due   Pneumococcal Vaccine 52-58 Years old (1 - PCV) Never done   PAP SMEAR-Modifier  Never done   INFLUENZA VACCINE  09/06/2020    Colorectal cancer screening: scheduled for GI consult 11/15/20 due to chronic constipation and discuss colonoscopy   Mammogram: due age 64  Bone density: due age 39  Lung Cancer Screening: (Low Dose CT Chest recommended if Age 62-80 years, 30 pack-year currently smoking OR have quit w/in 15years.) does not qualify.   Additional Screening:  Hepatitis C Screening: does qualify; Completed 2013  Vision Screening: Recommended annual ophthalmology exams for early detection of glaucoma and other disorders of the eye. Is the patient up to date with their annual eye exam?  No  Who is the provider or what is the name of the office in which the patient attends annual eye exams? Not established If pt is not established with a provider, would they like to be referred to a provider to establish care? No .   Dental Screening: Recommended annual dental exams for proper oral hygiene  Community Resource Referral / Chronic Care Management: CRR required this visit?  Yes  - see referral notes; housing instability  CCM required this visit?  Yes  - social work     Plan:     I have personally reviewed and noted the following in the patient's chart:   Medical and social history Use of alcohol, tobacco or illicit drugs  Current medications and supplements including opioid prescriptions. Patient is not currently taking opioid prescriptions. Functional ability and status Nutritional status Physical activity Advanced directives List of other  physicians Hospitalizations, surgeries, and ER visits in previous 12 months Vitals Screenings to include  cognitive, depression, and falls Referrals and appointments  In addition, I have reviewed and discussed with patient certain preventive protocols, quality metrics, and best practice recommendations. A written personalized care plan for preventive services as well as general preventive health recommendations were provided to patient.     Clemetine Marker, LPN   5/83/0940   Nurse Notes: pt advised needs to follow up with [facility] for routine care. Patient provided new phone # at front desk and phone # to [facility] provided. Pt needs recheck for TSH due to only taking 50 mcg of levothyroxine instead of prescribed 100 mcg dose. TSH recheck ordered today.

## 2020-09-23 ENCOUNTER — Other Ambulatory Visit: Payer: Self-pay

## 2020-09-23 DIAGNOSIS — E039 Hypothyroidism, unspecified: Secondary | ICD-10-CM

## 2020-09-23 LAB — TSH: TSH: 66.6 u[IU]/mL — ABNORMAL HIGH (ref 0.450–4.500)

## 2020-09-23 MED ORDER — LEVOTHYROXINE SODIUM 100 MCG PO TABS
100.0000 ug | ORAL_TABLET | Freq: Every day | ORAL | 1 refills | Status: DC
Start: 2020-09-23 — End: 2021-05-19

## 2020-09-24 ENCOUNTER — Telehealth: Payer: Self-pay | Admitting: Licensed Clinical Social Worker

## 2020-09-24 ENCOUNTER — Encounter (HOSPITAL_COMMUNITY): Payer: Self-pay | Admitting: Radiology

## 2020-09-24 NOTE — Telephone Encounter (Signed)
    Clinical Social Work  Care Management   Phone Outreach    09/24/2020 Name: JAZZLENE EDGINGTON MRN: [id number] DOB: 03/30/1985  [name] is a 35 y.o. year old female who is a primary care patient of Juline Patch, MD .   Reason for referral: Community Resources  and [place] and Resources.    CCM LCSW reached out to patient today by phone to introduce self, assess needs and offer Care Management services and interventions.     Plan: Patient agreed to scheduling an appointment with CCM LCSW for 10/06/20  Review of patient status, including review of consultants reports, relevant laboratory and other test results, and collaboration with appropriate care team members and the patient's provider was performed as part of comprehensive patient evaluation and provision of care management services.     Christa See, MSW, [email or web address] Phone [phone] 4:04 PM

## 2020-10-06 ENCOUNTER — Ambulatory Visit (INDEPENDENT_AMBULATORY_CARE_PROVIDER_SITE_OTHER): Payer: Medicare Other | Admitting: Licensed Clinical Social Worker

## 2020-10-06 ENCOUNTER — Telehealth: Payer: Self-pay

## 2020-10-06 DIAGNOSIS — R6250 Unspecified lack of expected normal physiological development in childhood: Secondary | ICD-10-CM

## 2020-10-06 DIAGNOSIS — Z599 Problem related to housing and economic circumstances, unspecified: Secondary | ICD-10-CM

## 2020-10-06 DIAGNOSIS — E039 Hypothyroidism, unspecified: Secondary | ICD-10-CM

## 2020-10-06 DIAGNOSIS — Z59819 Housing instability, housed unspecified: Secondary | ICD-10-CM

## 2020-10-06 NOTE — Telephone Encounter (Signed)
   Telephone encounter was:  Successful.  10/06/2020 Name: Desiree Gaines MRN: [id number] DOB: 24-Aug-1985  [name] is a 35 y.o. year old female who is a primary care patient of Juline Patch, MD . The community resource team was consulted for assistance with Transportation Needs  and housing.  Care guide performed the following interventions: Spoke with patient gave her information for [employer] [phone] (Main) and [employer] [phone] (Main), [employer] [phone]. Letter saved in Epic.  Follow Up Plan:  No further follow up planned at this time. The patient has been provided with needed resources.  Onnie Alatorre, AAS Paralegal, [place] [facility]  [address], [place] [address] [email or web address]  ?? [id number]   [email or web address]

## 2020-10-13 NOTE — Patient Instructions (Signed)
Visit Information   Goals Addressed             This Visit's Progress    Obtain safe and stable housing   On track    Timeframe:  Long-Range Goal Priority:  High Start Date:   10/08/20                          Expected End Date:   10/31                    Follow Up Date within 45 days from 10/08/20   Patient Goals/Self-Care Activities: Over the next 120 days Follow up with [facility] Peer Worker Follow up with SW CM at [employer] regarding housing resources Continue participation in counseling  Attend all scheduled appointments Contact office with any questions or concerns         Patient verbalizes understanding of instructions provided today.   The patient has been provided with contact information for the care management team and has been advised to call with any health related questions or concerns.   Christa See, MSW, [email or web address] Phone [phone] 11:32 AM
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
## 2020-10-20 ENCOUNTER — Other Ambulatory Visit: Payer: Self-pay | Admitting: Family Medicine

## 2020-10-20 NOTE — Telephone Encounter (Signed)
Requested medications are due for refill today.  yes  Requested medications are on the active medications list.  yes  Last refill. 08/24/2020  Future visit scheduled.   no  Notes to clinic.  Historical medication

## 2020-10-27 ENCOUNTER — Ambulatory Visit (INDEPENDENT_AMBULATORY_CARE_PROVIDER_SITE_OTHER): Payer: Medicare Other | Admitting: Licensed Clinical Social Worker

## 2020-10-27 DIAGNOSIS — E039 Hypothyroidism, unspecified: Secondary | ICD-10-CM

## 2020-10-27 DIAGNOSIS — Z599 Problem related to housing and economic circumstances, unspecified: Secondary | ICD-10-CM

## 2020-10-27 DIAGNOSIS — R6250 Unspecified lack of expected normal physiological development in childhood: Secondary | ICD-10-CM

## 2020-10-27 DIAGNOSIS — Z59819 Housing instability, housed unspecified: Secondary | ICD-10-CM

## 2020-10-29 NOTE — Patient Instructions (Signed)
Visit Information   Goals Addressed             This Visit's Progress    Obtain safe and stable housing   On track    Timeframe:  Long-Range Goal Priority:  High Start Date:   10/08/20                          Expected End Date:   10/31                    Follow Up Date 11/23/20   Patient Goals/Self-Care Activities: Over the next 120 days Follow up with [facility] Peer Worker Follow up with SW CM at [employer] regarding housing resources Continue participation in counseling  Attend all scheduled appointments Contact office with any questions or concerns         Patient verbalizes understanding of instructions provided today.   Telephone follow up appointment with care management team member scheduled for:11/23/20  Christa See, MSW, [email or web address] Phone [phone] 4:34 PM
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
## 2020-11-05 DIAGNOSIS — E039 Hypothyroidism, unspecified: Secondary | ICD-10-CM

## 2020-11-15 ENCOUNTER — Ambulatory Visit: Payer: Medicare Other | Admitting: Gastroenterology

## 2020-11-23 ENCOUNTER — Telehealth: Payer: Self-pay | Admitting: Licensed Clinical Social Worker

## 2020-11-23 NOTE — Telephone Encounter (Signed)
    Clinical Social Work  Chronic Care Management   Phone Outreach    11/23/2020 Name: KANESHIA CATER MRN: [id number] DOB: 1985/08/02  [name] is a 35 y.o. year old female who is a primary care patient of Juline Patch, MD .   Reason for referral: Mental Health Counseling and Resources.    F/U phone call today to assess needs, progress and barriers with care plan goals.   Telephone outreach was unsuccessful. A HIPPA compliant phone message was left for the patient providing contact information and requesting a return call.   Plan:CCM LCSW will wait for return call. If no return call is received, Will reach out to patient again in the next 30 days .   Review of patient status, including review of consultants reports, relevant laboratory and other test results, and collaboration with appropriate care team members and the patient's provider was performed as part of comprehensive patient evaluation and provision of care management services.     Christa See, MSW, [email or web address] Phone [phone] 10:48 AM

## 2020-12-21 ENCOUNTER — Ambulatory Visit (INDEPENDENT_AMBULATORY_CARE_PROVIDER_SITE_OTHER): Payer: Medicare Other | Admitting: Licensed Clinical Social Worker

## 2020-12-21 DIAGNOSIS — Z8639 Personal history of other endocrine, nutritional and metabolic disease: Secondary | ICD-10-CM

## 2020-12-21 DIAGNOSIS — E039 Hypothyroidism, unspecified: Secondary | ICD-10-CM

## 2020-12-21 DIAGNOSIS — R6250 Unspecified lack of expected normal physiological development in childhood: Secondary | ICD-10-CM

## 2020-12-21 DIAGNOSIS — Z59819 Housing instability, housed unspecified: Secondary | ICD-10-CM

## 2020-12-23 NOTE — Chronic Care Management (AMB) (Signed)
    Clinical Social Work  Care Management   Phone Outreach    12/23/2020 Name: Desiree Gaines MRN: [id number] DOB: [birth year]  [name] is a 35 y.o. year old female who is a primary care patient of Juline Patch, MD .   Reason for referral: Mental Health Counseling and Resources.    F/U phone call today to assess needs, progress and barriers with care plan goals.   Unable to keep phone appointment today and requested to reschedule.  Plan:Appointment was rescheduled with CCM LCSW  Review of patient status, including review of consultants reports, relevant laboratory and other test results, and collaboration with appropriate care team members and the patient's provider was performed as part of comprehensive patient evaluation and provision of care management services.     Christa See, MSW, [email or web address] Phone [phone] 10:05 AM

## 2021-01-12 ENCOUNTER — Ambulatory Visit: Payer: Medicare Other | Admitting: Gastroenterology

## 2021-03-02 ENCOUNTER — Ambulatory Visit: Payer: Medicare Other | Admitting: Gastroenterology

## 2021-05-03 ENCOUNTER — Telehealth: Payer: Self-pay

## 2021-05-03 NOTE — Telephone Encounter (Signed)
Patient left a voicemail about appointment with on 05/10/21. Called patient back and left a message for call back  ?

## 2021-05-10 ENCOUNTER — Encounter: Payer: Self-pay | Admitting: Gastroenterology

## 2021-05-10 ENCOUNTER — Ambulatory Visit (INDEPENDENT_AMBULATORY_CARE_PROVIDER_SITE_OTHER): Payer: Medicare Other | Admitting: Gastroenterology

## 2021-05-10 ENCOUNTER — Other Ambulatory Visit: Payer: Self-pay

## 2021-05-10 VITALS — BP 140/95 | HR 109 | Temp 98.1°F

## 2021-05-10 DIAGNOSIS — R935 Abnormal findings on diagnostic imaging of other abdominal regions, including retroperitoneum: Secondary | ICD-10-CM

## 2021-05-10 DIAGNOSIS — E039 Hypothyroidism, unspecified: Secondary | ICD-10-CM

## 2021-05-10 DIAGNOSIS — D649 Anemia, unspecified: Secondary | ICD-10-CM

## 2021-05-10 DIAGNOSIS — E538 Deficiency of other specified B group vitamins: Secondary | ICD-10-CM | POA: Diagnosis not present

## 2021-05-10 MED ORDER — NA SULFATE-K SULFATE-MG SULF 17.5-3.13-1.6 GM/177ML PO SOLN: 354.0000 mL | Freq: Once | ORAL | 0 refills | Status: AC

## 2021-05-10 NOTE — Progress Notes (Signed)
?  ?Jonathon Bellows MD, MRCP(U.K) ?[address]  ?[address]  ?[place], [place] [address]  ?Main: [phone]  ?Fax: [phone] ? ? ?Gastroenterology Consultation ? ?Referring Provider:     Ronny Bacon, MD ?Primary Care Physician:  Juline Patch, MD ?Primary Gastroenterologist:  Dr. Jonathon Bellows  ?Reason for Consultation:   Constipation  ?      ? HPI:   ?Desiree Gaines is a 36 y.o. y/o female referred for a colonoscopy in view of constipation and bloating in 08/25/2020 by Dr. Christian Mate.  She has seen Dr. Christian Mate for the same since July 2022.  It was felt that she had a dilated sigmoid colon and was at risk for obstruction or torsion but was asymptomatic.  Recommended to have a colonoscopy first before any further evaluation by surgery was required. ? ?08/25/2020 CT abdomen pelvis showed sigmoid colon is rotated and dilated up to 7.3 cm with majority of sigmoid colon present in the anterior upper abdominal but with stool and gas visualized to the level of the rectum findings suggestive of sigmoid volvulus without high-grade obstruction or evidence of ischemia.  Diastases rectus. ? ?08/19/2020 hemoglobin 8.5 g low hemoglobin for more than a few months.  No MCV available no iron studies available recently but in 07/26/2020 iron studies were normal free T4 is low B12 was low TSH was very high.  No recent labs. ? ?She is here today with her caregiver.  She denies any complaints.  She says she has a bowel movement every other day.  Denies any constipation.  She states she is anemic.  Not seen her primary care physician for over a year..  Denies taking her thyroxine pills.  She states she needs to get a refill. ? ?Past Medical History:  ?Diagnosis Date  ? Anemia   ? Graves disease   ? Pituitary tumor   ? Thyroid disease   ? Umbilical hernia   ? ? ?Past Surgical History:  ?Procedure Laterality Date  ? CESAREAN SECTION  06/2011  ? x 2  ? tubes in ears Bilateral   ? ? ?Prior to Admission medications   ?Medication  Sig Start Date End Date Taking? Authorizing Provider  ?folic acid (FOLVITE) 1 MG tablet Take 1 mg by mouth daily. 08/24/20   [provider]  ?levothyroxine (SYNTHROID) 100 MCG tablet Take 1 tablet (100 mcg total) by mouth daily. Follow up with your PCP for repeat labs 09/23/20 10/23/20  Juline Patch, MD  ? ? ?Family History  ?Problem Relation Age of Onset  ? Diabetes Mother   ? Hypertension Mother   ? Stroke Mother   ? Hypertension Sister   ? Breast cancer Sister   ? Heart disease Brother   ?  ? ?Social History  ? ?Tobacco Use  ? Smoking status: Never  ?  Passive exposure: Never  ? Smokeless tobacco: Never  ?Vaping Use  ? Vaping Use: Never used  ?Substance Use Topics  ? Alcohol use: Not Currently  ? Drug use: Never  ? ? ?Allergies as of 05/10/2021  ? (No Known Allergies)  ? ? ?Review of Systems:    ?All systems reviewed and negative except where noted in HPI. ? ? Physical Exam:  ?BP (!) 140/95 (BP Location: Left Arm, Patient Position: Sitting, Cuff Size: Large)   Pulse (!) 109   Temp 98.1 ?F (36.7 ?C) (Oral)  ?No LMP recorded. ?Psych: Appears drowsy ?General:   Alert,   ?Head:  Normocephalic and atraumatic. ?Eyes:  Bilateral ptosis edema of the eyelids ?Ears:  Normal auditory acuity. ?Neck:  Supple; no masses or thyromegaly. ?Skin appears dry ?Abdomen: Distended, no tenderness, bowel sounds present no guarding rigidity ?Neurologic:  Alert and oriented x3;  grossly normal neurologically. ?Psych:  Alert and cooperative. Normal mood and affect. ? ?Imaging Studies: ?No results found. ? ?Assessment and Plan:  ? ?Desiree Gaines is a 36 y.o. y/o female has been referred for a colonoscopy for sigmoid volvulus noted on the CAT scan in July 2022.  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  She says she has not been taking her thyroxine pills.She has not had any labs since last year.  ? ? ?Plan ?1.  Check TSH, free  t4, CBC, CMP, B12 level, iron studies.  If has iron deficiency will need EGD as well if still B12 deficient will still need EGD along with colonoscopy. ? ?2. Adrenal nodule on Ct scan in 08/2020: needs follow up with Juline Patch, MD  ? ? ?I have discussed alternative options, risks & benefits,  which include, but are not limited to, bleeding, infection, perforation,respiratory complication & drug reaction.  The patient agrees with this plan & written consent will be obtained.   ? ? ? ?Follow up in 3 months ? ?Dr Jonathon Bellows MD,MRCP(U.K) ? ?

## 2021-05-12 ENCOUNTER — Telehealth: Payer: Self-pay

## 2021-05-12 NOTE — Telephone Encounter (Signed)
Called patient sister verbalized understanding of results.  Called patient and patient verbalized understanding  ?

## 2021-05-12 NOTE — Progress Notes (Signed)
Desiree Gaines inform her TSH is very high either not taking her meds or needs higher dose. She needs to touch base with her doctor . Please schedule colonoscopy a few weeks after she has started taking her thyroxine  ? ?C/c Juline Patch, MD  ? ?Dr Jonathon Bellows MD,MRCP [facility]) ?Gastroenterology/Hepatology ?Pager: [phone] ?

## 2021-05-12 NOTE — Telephone Encounter (Signed)
-----   Message from Jonathon Bellows, MD sent at 05/12/2021  8:09 AM EDT ----- ?Maritza inform her TSH is very high either not taking her meds or needs higher dose. She needs to touch base with her doctor . Please schedule colonoscopy a few weeks after she has started taking her thyroxine  ? ?C/c Juline Patch, MD  ? ?Dr Jonathon Bellows MD,MRCP [facility]) ?Gastroenterology/Hepatology ?Pager: [phone] ? ?

## 2021-05-19 ENCOUNTER — Encounter: Payer: Self-pay | Admitting: Family Medicine

## 2021-05-19 ENCOUNTER — Ambulatory Visit (INDEPENDENT_AMBULATORY_CARE_PROVIDER_SITE_OTHER): Payer: Medicare Other | Admitting: Family Medicine

## 2021-05-19 VITALS — BP 110/88 | HR 88 | Ht 64.0 in | Wt 219.0 lb

## 2021-05-19 DIAGNOSIS — E039 Hypothyroidism, unspecified: Secondary | ICD-10-CM

## 2021-05-19 DIAGNOSIS — Z862 Personal history of diseases of the blood and blood-forming organs and certain disorders involving the immune mechanism: Secondary | ICD-10-CM

## 2021-05-19 DIAGNOSIS — D649 Anemia, unspecified: Secondary | ICD-10-CM | POA: Diagnosis not present

## 2021-05-19 DIAGNOSIS — E278 Other specified disorders of adrenal gland: Secondary | ICD-10-CM

## 2021-05-19 DIAGNOSIS — Z124 Encounter for screening for malignant neoplasm of cervix: Secondary | ICD-10-CM

## 2021-05-19 DIAGNOSIS — E782 Mixed hyperlipidemia: Secondary | ICD-10-CM | POA: Diagnosis not present

## 2021-05-19 MED ORDER — LEVOTHYROXINE SODIUM 100 MCG PO TABS: 100.0000 ug | ORAL_TABLET | Freq: Every day | ORAL | 1 refills | Status: DC

## 2021-05-19 NOTE — Progress Notes (Signed)
? ? ?Date:  05/19/2021  ? ?Name:  Desiree Gaines   DOB:  19-Aug-1985   MRN:  [id number] ? ? ?Chief Complaint: Hypothyroidism (Pt is accompanied by Nicole/ assistant of care) ? ?Thyroid Problem ?Presents for follow-up visit. Symptoms include anxiety and cold intolerance. Patient reports no constipation, depressed mood, diaphoresis, diarrhea, dry skin, hair loss, menstrual problem, nail problem or palpitations.  ? ?Lab Results  ?Component Value Date  ? NA 137 08/19/2020  ? K 5.0 08/19/2020  ? CO2 25 08/07/2020  ? GLUCOSE 94 08/07/2020  ? BUN 11 08/19/2020  ? CREATININE 0.8 08/19/2020  ? CALCIUM 10.3 08/19/2020  ? EGFR 76 07/12/2020  ? GFRNONAA >60 08/07/2020  ? ?No results found for: CHOL, HDL, LDLCALC, LDLDIRECT, TRIG, CHOLHDL ?Lab Results  ?Component Value Date  ? TSH 82.800 (H) 05/10/2021  ? ?No results found for: HGBA1C ?Lab Results  ?Component Value Date  ? WBC 8.1 08/19/2020  ? HGB 9.0 (A) 08/19/2020  ? HCT 28 (A) 08/19/2020  ? MCV 97.8 08/07/2020  ? PLT 258 08/19/2020  ? ?Lab Results  ?Component Value Date  ? ALT 25 08/07/2020  ? AST 16 08/07/2020  ? ALKPHOS 112 08/07/2020  ? BILITOT 0.6 08/07/2020  ? ?No results found for: 25OHVITD2, [place], VD25OH  ? ?Review of Systems  ?Constitutional:  Negative for chills, diaphoresis and fever.  ?HENT:  Negative for drooling, ear discharge, ear pain and sore throat.   ?Respiratory:  Negative for cough, shortness of breath and wheezing.   ?Cardiovascular:  Negative for chest pain, palpitations and leg swelling.  ?Gastrointestinal:  Negative for abdominal pain, blood in stool, constipation, diarrhea and nausea.  ?Endocrine: Positive for cold intolerance. Negative for polydipsia.  ?Genitourinary:  Negative for dysuria, frequency, hematuria, menstrual problem and urgency.  ?Musculoskeletal:  Negative for back pain, myalgias and neck pain.  ?Skin:  Negative for rash.  ?Allergic/Immunologic: Negative for environmental allergies.  ?Neurological:  Negative for dizziness and  headaches.  ?Hematological:  Does not bruise/bleed easily.  ?Psychiatric/Behavioral:  Negative for suicidal ideas. The patient is nervous/anxious.   ? ?Patient Active Problem List  ? Diagnosis Date Noted  ? Chronic constipation 09/02/2020  ? Encounter for screening colonoscopy 09/02/2020  ? 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 Ventral hernia without obstruction or gangrene 08/12/2020  ? Facial cellulitis 08/02/2020  ? Hypothyroidism 08/02/2020  ? Acute posthemorrhagic anemia 07/09/2011  ? Delivery with history of cesarean section 07/08/2011  ? Other postprocedural status(V45.89) 07/08/2011  ? Iron deficiency anemia 03/06/2011  ? Maternal thyroid dysfunction, antepartum 01/21/2007  ? Developmental delay 06/19/2000  ? ? ?No Known Allergies ? ?Past Surgical History:  ?Procedure Laterality Date  ? CESAREAN SECTION  06/2011  ? x 2  ? tubes in ears Bilateral   ? ? ?Social History  ? ?Tobacco Use  ? Smoking status: Never  ?  Passive exposure: Never  ? Smokeless tobacco: Never  ?Vaping Use  ? Vaping Use: Never used  ?Substance Use Topics  ? Alcohol use: Not Currently  ? Drug use: Never  ? ? ? ?Medication list has been reviewed and updated. ? ?Current Meds  ?Medication Sig  ? levothyroxine (SYNTHROID) 100 MCG tablet Take 1 tablet (100 mcg total) by mouth daily. Follow up with your PCP for repeat labs  ? ? ? ?  05/19/2021  ?  9:39 AM 08/19/2020  ?  8:24 AM 07/12/2020  ?  3:00 PM  ?GAD 7 : Generalized Anxiety Score  ?Nervous, Anxious, on Edge  1 0 3  ?Control/stop worrying 1 0 2  ?Worry too much - different things 1 0 2  ?Trouble relaxing 0 0 1  ?Restless 0 0 1  ?Easily annoyed or irritable 0 0 1  ?Afraid - awful might happen 0 0 1  ?Total GAD 7 Score 3 0 11  ?Anxiety Difficulty Not difficult at all  Not difficult at all  ? ? ? ?  05/19/2021  ?  9:38 AM  ?Depression screen PHQ 2/9  ?Decreased Interest 0  ?Down, Depressed, Hopeless 1  ?PHQ - 2 Score 1  ?Altered sleeping 0  ?Tired, decreased energy 1  ?Change in appetite 0  ?Feeling  bad or failure about yourself  0  ?Trouble concentrating 0  ?Moving slowly or fidgety/restless 1  ?Suicidal thoughts 0  ?PHQ-9 Score 3  ?Difficult doing work/chores Not difficult at all  ? ? ?BP Readings from Last 3 Encounters:  ?05/19/21 110/88  ?05/10/21 (!) 140/95  ?09/22/20 108/72  ? ? ?Physical Exam ?Vitals and nursing note reviewed. Exam conducted with a chaperone present.  ?Constitutional:   ?   General: She is not in acute distress. ?   Appearance: She is not diaphoretic.  ?HENT:  ?   Head: Normocephalic and atraumatic.  ?   Right Ear: Tympanic membrane and external ear normal.  ?   Left Ear: Tympanic membrane and external ear normal.  ?   Nose: Nose normal. No congestion or rhinorrhea.  ?Eyes:  ?   General:     ?   Right eye: No discharge.     ?   Left eye: No discharge.  ?   Conjunctiva/sclera: Conjunctivae normal.  ?   Pupils: Pupils are equal, round, and reactive to light.  ?Neck:  ?   Thyroid: No thyromegaly.  ?   Vascular: No JVD.  ?Cardiovascular:  ?   Rate and Rhythm: Normal rate and regular rhythm.  ?   Heart sounds: Normal heart sounds. No murmur heard. ?  No friction rub. No gallop.  ?Pulmonary:  ?   Effort: Pulmonary effort is normal.  ?   Breath sounds: Normal breath sounds. No wheezing, rhonchi or rales.  ?Abdominal:  ?   General: Bowel sounds are normal.  ?   Palpations: Abdomen is soft. There is no mass.  ?   Tenderness: There is no abdominal tenderness. There is no guarding.  ?Musculoskeletal:     ?   General: Normal range of motion.  ?   Cervical back: Normal range of motion and neck supple.  ?Lymphadenopathy:  ?   Cervical: No cervical adenopathy.  ?Skin: ?   General: Skin is warm and dry.  ?Neurological:  ?   Mental Status: She is alert.  ?   Deep Tendon Reflexes: Reflexes are normal and symmetric.  ? ? ?Wt Readings from Last 3 Encounters:  ?05/19/21 219 lb (99.3 kg)  ?09/22/20 191 lb (86.6 kg)  ?09/02/20 188 lb 9.6 oz (85.5 kg)  ? ? ?BP 110/88   Pulse 88   Ht _0  (1.626 m)   Wt 219  lb (99.3 kg)   LMP 04/19/2021 (Approximate)   BMI 37.59 kg/m?  ? ?Assessment and Plan: ? ?1. Acquired hypothyroidism ?Chronic.  Uncontrolled.  Patient has not been on any medications for several months due to her pharmacy closing.  We will resume today Synthroid at 100 mcg once a day.  We will repeat labs in 1 week to see if this is sufficient prior  to procedure. ?- levothyroxine (SYNTHROID) 100 MCG tablet; Take 1 tablet (100 mcg total) by mouth daily. Follow up with your PCP for repeat labs  Dispense: 30 tablet; Refill: 1 ? ?2. Adrenal nodule ([place]) ?Review of previous CT scan notes that there is an adrenal nodule and left adrenal gland and we will further evaluate with CT of the adrenal protocol. ?- CT ADRENAL ABD WO ?- Lipid Panel With LDL/HDL Ratio ? ?3. Cervical cancer screening ?Chronic.  Controlled.  Patient is due for a Pap and we will refer to gynecology for maintenance and cervical cancer screening. ?- Ambulatory referral to Gynecology ? ?4. History of anemia ?Previously noted history of anemia.  We will check CBC for evaluation of current status of hemoglobin. ?- CBC w/Diff/Platelet ?- Lipid Panel With LDL/HDL Ratio ?- Renal Function Panel  ? ? ?

## 2021-05-20 LAB — CBC WITH DIFFERENTIAL/PLATELET
Basophils Absolute: 0 10*3/uL (ref 0.0–0.2)
Basos: 1 %
EOS (ABSOLUTE): 0 10*3/uL (ref 0.0–0.4)
Eos: 1 %
Hematocrit: 28.9 % — ABNORMAL LOW (ref 34.0–46.6)
Hemoglobin: 9.8 g/dL — ABNORMAL LOW (ref 11.1–15.9)
Immature Grans (Abs): 0.1 10*3/uL (ref 0.0–0.1)
Immature Granulocytes: 2 %
Lymphocytes Absolute: 1.1 10*3/uL (ref 0.7–3.1)
Lymphs: 22 %
MCH: 32.7 pg (ref 26.6–33.0)
MCHC: 33.9 g/dL (ref 31.5–35.7)
MCV: 96 fL (ref 79–97)
Monocytes Absolute: 0.5 10*3/uL (ref 0.1–0.9)
Monocytes: 10 %
Neutrophils Absolute: 3.1 10*3/uL (ref 1.4–7.0)
Neutrophils: 64 %
Platelets: 203 10*3/uL (ref 150–450)
RBC: 3 x10E6/uL — ABNORMAL LOW (ref 3.77–5.28)
RDW: 14 % (ref 11.7–15.4)
WBC: 4.8 10*3/uL (ref 3.4–10.8)

## 2021-05-20 LAB — RENAL FUNCTION PANEL
Albumin: 4.9 g/dL — ABNORMAL HIGH (ref 3.8–4.8)
BUN/Creatinine Ratio: 9 (ref 9–23)
BUN: 11 mg/dL (ref 6–20)
CO2: 20 mmol/L (ref 20–29)
Calcium: 10.5 mg/dL — ABNORMAL HIGH (ref 8.7–10.2)
Chloride: 105 mmol/L (ref 96–106)
Creatinine, Ser: 1.17 mg/dL — ABNORMAL HIGH (ref 0.57–1.00)
Glucose: 99 mg/dL (ref 70–99)
Phosphorus: 2.7 mg/dL — ABNORMAL LOW (ref 3.0–4.3)
Potassium: 4.5 mmol/L (ref 3.5–5.2)
Sodium: 139 mmol/L (ref 134–144)
eGFR: 63 mL/min/{1.73_m2} (ref >59)

## 2021-05-20 LAB — LIPID PANEL WITH LDL/HDL RATIO
Cholesterol, Total: 173 mg/dL (ref 100–199)
HDL: 49 mg/dL (ref >39)
LDL Chol Calc (NIH): 95 mg/dL (ref 0–99)
LDL/HDL Ratio: 1.9 ratio (ref 0.0–3.2)
Triglycerides: 171 mg/dL — ABNORMAL HIGH (ref 0–149)
VLDL Cholesterol Cal: 29 mg/dL (ref 5–40)

## 2021-05-23 LAB — IRON,TIBC AND FERRITIN PANEL
Ferritin: 26 ng/mL (ref 15–150)
Iron Saturation: 18 % (ref 15–55)
Iron: 65 ug/dL (ref 27–159)
Total Iron Binding Capacity: 363 ug/dL (ref 250–450)
UIBC: 298 ug/dL (ref 131–425)

## 2021-05-23 LAB — TSH+FREE T4: TSH: 82.8 u[IU]/mL — ABNORMAL HIGH (ref 0.450–4.500)

## 2021-05-23 LAB — B12 AND FOLATE PANEL
Folate: 3.3 ng/mL (ref >3.0)
Vitamin B-12: 322 pg/mL (ref 232–1245)

## 2021-05-24 ENCOUNTER — Other Ambulatory Visit: Payer: Self-pay

## 2021-05-24 DIAGNOSIS — D649 Anemia, unspecified: Secondary | ICD-10-CM

## 2021-05-24 DIAGNOSIS — Z862 Personal history of diseases of the blood and blood-forming organs and certain disorders involving the immune mechanism: Secondary | ICD-10-CM

## 2021-05-24 LAB — SPECIMEN STATUS REPORT

## 2021-05-24 LAB — FERRITIN: Ferritin: 216 ng/mL — ABNORMAL HIGH (ref 15–150)

## 2021-05-24 MED ORDER — FOLIC ACID 1 MG PO TABS: 1.0000 mg | ORAL_TABLET | Freq: Every day | ORAL | 0 refills | Status: DC

## 2021-05-24 MED ORDER — IRON (FERROUS SULFATE) 325 (65 FE) MG PO TABS: 325.0000 mg | ORAL_TABLET | Freq: Every day | ORAL | 0 refills | Status: DC

## 2021-05-26 ENCOUNTER — Telehealth: Payer: Self-pay | Admitting: Gastroenterology

## 2021-05-26 ENCOUNTER — Other Ambulatory Visit: Payer: Self-pay

## 2021-05-26 ENCOUNTER — Ambulatory Visit: Payer: Medicare Other

## 2021-05-26 DIAGNOSIS — E039 Hypothyroidism, unspecified: Secondary | ICD-10-CM | POA: Diagnosis not present

## 2021-05-26 DIAGNOSIS — Z1211 Encounter for screening for malignant neoplasm of colon: Secondary | ICD-10-CM

## 2021-05-26 MED ORDER — NA SULFATE-K SULFATE-MG SULF 17.5-3.13-1.6 GM/177ML PO SOLN: 1.0000 | Freq: Once | ORAL | 0 refills | Status: AC

## 2021-05-26 NOTE — Telephone Encounter (Signed)
Pt left message to get appointment for colonoscpy ?

## 2021-05-26 NOTE — Progress Notes (Signed)
Rescheduled patient as patient requested ?

## 2021-05-27 ENCOUNTER — Telehealth: Payer: Self-pay

## 2021-05-27 LAB — THYROID PANEL WITH TSH
Free Thyroxine Index: 0.9 — ABNORMAL LOW (ref 1.2–4.9)
T3 Uptake Ratio: 15 % — ABNORMAL LOW (ref 24–39)
T4, Total: 6.1 ug/dL (ref 4.5–12.0)
TSH: 49.2 u[IU]/mL — ABNORMAL HIGH (ref 0.450–4.500)

## 2021-05-27 NOTE — Telephone Encounter (Signed)
Called and spoke to Desiree Gaines concerning the change in colonoscopy appt to June 12th- she is aware to call the day before to get the time to arrive ?

## 2021-05-31 ENCOUNTER — Encounter: Admission: RE | Payer: Self-pay | Source: Home / Self Care

## 2021-05-31 ENCOUNTER — Ambulatory Visit: Admission: RE | Admit: 2021-05-31 | Payer: Medicare Other | Source: Home / Self Care | Admitting: Gastroenterology

## 2021-05-31 ENCOUNTER — Ambulatory Visit: Payer: Medicare Other

## 2021-05-31 SURGERY — COLONOSCOPY WITH PROPOFOL
Anesthesia: General

## 2021-06-20 ENCOUNTER — Ambulatory Visit
Admission: RE | Admit: 2021-06-20 | Discharge: 2021-06-20 | Disposition: A | Discharge status: Home / Self Care | Payer: Medicare Other | Source: Ambulatory Visit | Attending: Family Medicine | Admitting: Family Medicine

## 2021-06-20 DIAGNOSIS — Z862 Personal history of diseases of the blood and blood-forming organs and certain disorders involving the immune mechanism: Secondary | ICD-10-CM | POA: Insufficient documentation

## 2021-06-20 DIAGNOSIS — Q433 Congenital malformations of intestinal fixation: Secondary | ICD-10-CM | POA: Diagnosis not present

## 2021-06-20 DIAGNOSIS — Q438 Other specified congenital malformations of intestine: Secondary | ICD-10-CM | POA: Diagnosis not present

## 2021-06-20 DIAGNOSIS — Z79890 Hormone replacement therapy (postmenopausal): Secondary | ICD-10-CM | POA: Diagnosis not present

## 2021-06-20 DIAGNOSIS — F419 Anxiety disorder, unspecified: Secondary | ICD-10-CM | POA: Insufficient documentation

## 2021-06-20 DIAGNOSIS — E278 Other specified disorders of adrenal gland: Secondary | ICD-10-CM | POA: Insufficient documentation

## 2021-06-20 DIAGNOSIS — K828 Other specified diseases of gallbladder: Secondary | ICD-10-CM | POA: Diagnosis not present

## 2021-06-20 DIAGNOSIS — M6208 Separation of muscle (nontraumatic), other site: Secondary | ICD-10-CM | POA: Insufficient documentation

## 2021-06-20 DIAGNOSIS — E039 Hypothyroidism, unspecified: Secondary | ICD-10-CM | POA: Diagnosis not present

## 2021-06-20 DIAGNOSIS — K760 Fatty (change of) liver, not elsewhere classified: Secondary | ICD-10-CM | POA: Diagnosis not present

## 2021-06-20 DIAGNOSIS — E279 Disorder of adrenal gland, unspecified: Secondary | ICD-10-CM | POA: Diagnosis not present

## 2021-07-14 ENCOUNTER — Other Ambulatory Visit: Payer: Self-pay | Admitting: Family Medicine

## 2021-07-14 DIAGNOSIS — E039 Hypothyroidism, unspecified: Secondary | ICD-10-CM

## 2021-07-15 ENCOUNTER — Other Ambulatory Visit: Payer: Self-pay

## 2021-07-15 DIAGNOSIS — E039 Hypothyroidism, unspecified: Secondary | ICD-10-CM | POA: Diagnosis not present

## 2021-07-15 NOTE — Progress Notes (Signed)
Printed thyroid

## 2021-07-16 LAB — THYROID PANEL WITH TSH
Free Thyroxine Index: 0.8 — ABNORMAL LOW (ref 1.2–4.9)
T3 Uptake Ratio: 18 % — ABNORMAL LOW (ref 24–39)
T4, Total: 4.6 ug/dL (ref 4.5–12.0)
TSH: 37.6 u[IU]/mL — ABNORMAL HIGH (ref 0.450–4.500)

## 2021-07-18 ENCOUNTER — Other Ambulatory Visit: Payer: Self-pay

## 2021-07-18 ENCOUNTER — Encounter
Admission: RE | Disposition: A | Discharge status: Home / Self Care | Payer: Self-pay | Source: Home / Self Care | Attending: Gastroenterology

## 2021-07-18 ENCOUNTER — Ambulatory Visit: Payer: Medicare Other | Admitting: Certified Registered Nurse Anesthetist

## 2021-07-18 ENCOUNTER — Encounter: Payer: Self-pay | Admitting: Gastroenterology

## 2021-07-18 ENCOUNTER — Ambulatory Visit
Admission: RE | Admit: 2021-07-18 | Discharge: 2021-07-18 | Disposition: A | Discharge status: Home / Self Care | Payer: Medicare Other | Attending: Gastroenterology | Admitting: Gastroenterology

## 2021-07-18 DIAGNOSIS — E0500 Thyrotoxicosis with diffuse goiter without thyrotoxic crisis or storm: Secondary | ICD-10-CM | POA: Diagnosis not present

## 2021-07-18 DIAGNOSIS — Z8719 Personal history of other diseases of the digestive system: Secondary | ICD-10-CM | POA: Diagnosis present

## 2021-07-18 DIAGNOSIS — K635 Polyp of colon: Secondary | ICD-10-CM | POA: Diagnosis not present

## 2021-07-18 DIAGNOSIS — K562 Volvulus: Secondary | ICD-10-CM

## 2021-07-18 DIAGNOSIS — D122 Benign neoplasm of ascending colon: Secondary | ICD-10-CM | POA: Diagnosis not present

## 2021-07-18 DIAGNOSIS — E039 Hypothyroidism, unspecified: Secondary | ICD-10-CM | POA: Diagnosis not present

## 2021-07-18 DIAGNOSIS — D649 Anemia, unspecified: Secondary | ICD-10-CM | POA: Insufficient documentation

## 2021-07-18 DIAGNOSIS — Z1211 Encounter for screening for malignant neoplasm of colon: Secondary | ICD-10-CM

## 2021-07-18 HISTORY — DX: Disorder of adrenal gland, unspecified: E27.9

## 2021-07-18 HISTORY — DX: Hypothyroidism, unspecified: E03.9

## 2021-07-18 HISTORY — PX: COLONOSCOPY WITH PROPOFOL: SHX5780

## 2021-07-18 HISTORY — DX: Other specified disorders of adrenal gland: E27.8

## 2021-07-18 LAB — POCT PREGNANCY, URINE: Preg Test, Ur: NEGATIVE

## 2021-07-18 SURGERY — COLONOSCOPY WITH PROPOFOL
Anesthesia: General

## 2021-07-18 MED ORDER — PROPOFOL 10 MG/ML IV BOLUS
INTRAVENOUS | Status: DC | PRN
  Administered 2021-07-18: 80 mg via INTRAVENOUS

## 2021-07-18 MED ORDER — SODIUM CHLORIDE 0.9 % IV SOLN: INTRAVENOUS | Status: DC

## 2021-07-18 MED ORDER — PROPOFOL 500 MG/50ML IV EMUL
INTRAVENOUS | Status: DC | PRN
  Administered 2021-07-18: 200 ug/kg/min via INTRAVENOUS

## 2021-07-18 MED ORDER — LEVOTHYROXINE SODIUM 125 MCG PO TABS: 125.0000 ug | ORAL_TABLET | Freq: Every day | ORAL | 1 refills | Status: DC

## 2021-07-18 MED ORDER — LIDOCAINE HCL (CARDIAC) PF 100 MG/5ML IV SOSY
PREFILLED_SYRINGE | INTRAVENOUS | Status: DC | PRN
  Administered 2021-07-18: 50 mg via INTRAVENOUS

## 2021-07-18 NOTE — Op Note (Signed)
[facility] Gastro[facility]ology Patient Name: Desiree Gaines Procedure Date: 07/18/2021 8:59 AM MRN: [id number] Account #: [id number] Date of Birth: 1985/09/23 Admit Type: Outpatient Age: 36 Room: [facility] ENDO ROOM 1 Gender: Female Note Status: Finalized Instrument Name: Colonoscope [id number] Procedure:             Colonoscopy Indications:           Follow-up of volvulus Providers:             Jonathon Bellows MD, MD Referring MD:          Juline Patch, MD (Referring MD) Medicines:             Monitored Anesthesia Care Complications:         No immediate complications. Procedure:             Pre-Anesthesia Assessment:                        - Prior to the procedure, a History and Physical was                         performed, and patient medications, allergies and                         sensitivities were reviewed. The patient's tolerance                         of previous anesthesia was reviewed.                        - The risks and benefits of the procedure and the                         sedation options and risks were discussed with the                         patient. All questions were answered and informed                         consent was obtained.                        - ASA Grade Assessment: II - A patient with mild                         systemic disease.                        After obtaining informed consent, the colonoscope was                         passed under direct vision. Throughout the procedure,                         the patient's blood pressure, pulse, and oxygen                         saturations were monitored continuously. The                         Colonoscope was introduced through  the anus and                         advanced to the the cecum, identified by the                         appendiceal orifice. The colonoscopy was performed                         without difficulty. The patient tolerated the                          procedure well. The quality of the bowel preparation                         was adequate. Findings:      The perianal and digital rectal examinations were normal.      A 5 mm polyp was found in the ascending colon. The polyp was sessile.       The polyp was removed with a cold snare. Resection and retrieval were       complete.      The lumen of the colon (entire examined portion) was mildly dilated.      The exam was otherwise without abnormality on direct and retroflexion       views. Impression:            - One 5 mm polyp in the ascending colon, removed with                         a cold snare. Resected and retrieved.                        - Dilated in the entire examined colon.                        - The examination was otherwise normal on direct and                         retroflexion views. Recommendation:        - Discharge patient to home (with escort).                        - Resume previous diet.                        - Continue present medications.                        - Await pathology results.                        - Repeat colonoscopy for surveillance based on                         pathology results. Procedure Code(s):     --- Professional ---                        [phone], Colonoscopy, flexible; with removal of  tumor(s), polyp(s), or other lesion(s) by snare                         technique Diagnosis Code(s):     --- Professional ---                        K63.5, Polyp of colon                        K59.39, Other megacolon                        K56.2, Volvulus CPT copyright 2019 American Medical Association. All rights reserved. The codes documented in this report are preliminary and upon coder review may  be revised to meet current compliance requirements. Jonathon Bellows, MD Jonathon Bellows MD, MD 07/18/2021 9:29:33 AM This report has been signed electronically. Number of Addenda: 0 Note Initiated On: 07/18/2021 8:59 AM Scope  Withdrawal Time: 0 hours 12 minutes 1 second  Total Procedure Duration: 0 hours 17 minutes 41 seconds  Estimated Blood Loss:  Estimated blood loss: none.      [facility]

## 2021-07-18 NOTE — Anesthesia Procedure Notes (Signed)
Date/Time: 07/18/2021 9:09 AM  Performed by: Lily Peer, Kaleem Sartwell, CRNAPre-anesthesia Checklist: Patient identified, Emergency Drugs available, Suction available, Patient being monitored and Timeout performed Patient Re-evaluated:Patient Re-evaluated prior to induction Oxygen Delivery Method: Nasal cannula Induction Type: IV induction

## 2021-07-18 NOTE — Progress Notes (Signed)
Sent in 146mg

## 2021-07-18 NOTE — Anesthesia Postprocedure Evaluation (Signed)
Anesthesia Post Note  Patient: Desiree Gaines  Procedure(s) Performed: COLONOSCOPY WITH PROPOFOL  Patient location during evaluation: Endoscopy Anesthesia Type: General Level of consciousness: awake and alert Pain management: pain level controlled Vital Signs Assessment: post-procedure vital signs reviewed and stable Respiratory status: spontaneous breathing, nonlabored ventilation, respiratory function stable and patient connected to nasal cannula oxygen Cardiovascular status: blood pressure returned to baseline and stable Postop Assessment: no apparent nausea or vomiting Anesthetic complications: no   No notable events documented.   Last Vitals:  Vitals:   07/18/21 0941 07/18/21 0951  BP: 137/76 140/80  Pulse: (!) 102   Resp: (!) 24 12  Temp:    SpO2: 100%     Last Pain:  Vitals:   07/18/21 0941  TempSrc:   PainSc: 0-No pain                 Precious Haws Lacey Wallman

## 2021-07-18 NOTE — Anesthesia Preprocedure Evaluation (Signed)
Anesthesia Evaluation  Patient identified by MRN, date of birth, ID band Patient awake    Reviewed: Allergy & Precautions, NPO status , Patient's Chart, lab work & pertinent test results  History of Anesthesia Complications Negative for: history of anesthetic complications  Airway Mallampati: III  TM Distance: >3 FB Neck ROM: full    Dental  (+) Chipped, Poor Dentition, Missing   Pulmonary neg pulmonary ROS, neg shortness of breath,    Pulmonary exam normal        Cardiovascular Exercise Tolerance: Good (-) anginanegative cardio ROS Normal cardiovascular exam     Neuro/Psych negative neurological ROS  negative psych ROS   GI/Hepatic negative GI ROS, Neg liver ROS,   Endo/Other  Hypothyroidism   Renal/GU negative Renal ROS  negative genitourinary   Musculoskeletal   Abdominal   Peds  Hematology negative hematology ROS (+)   Anesthesia Other Findings Past Medical History: No date: Anemia No date: Graves disease 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 No date: Thyroid disease No date: Umbilical hernia  Past Surgical History: 06/2011: CESAREAN SECTION     Comment:  x 2 No date: tubes in ears; Bilateral  BMI    Body Mass Index: 36.90 kg/m      Reproductive/Obstetrics negative OB ROS                             Anesthesia Physical Anesthesia Plan  ASA: 3  Anesthesia Plan: General   Post-op Pain Management:    Induction: Intravenous  PONV Risk Score and Plan: Propofol infusion and TIVA  Airway Management Planned: Natural Airway and Nasal Cannula  Additional Equipment:   Intra-op Plan:   Post-operative Plan:   Informed Consent: I have reviewed the patients History and Physical, chart, labs and discussed the procedure including the risks, benefits and alternatives for the proposed anesthesia with the patient or authorized representative who has indicated his/her understanding and  acceptance.     Dental Advisory Given  Plan Discussed with: Anesthesiologist, CRNA and Surgeon  Anesthesia Plan Comments: (Patient consented for risks of anesthesia including but not limited to:  - adverse reactions to medications - risk of airway placement if required - damage to eyes, teeth, lips or other oral mucosa - nerve damage due to positioning  - sore throat or hoarseness - Damage to heart, brain, nerves, lungs, other parts of body or loss of life  Patient voiced understanding.)        Anesthesia Quick Evaluation

## 2021-07-18 NOTE — Transfer of Care (Signed)
Immediate Anesthesia Transfer of Care Note  Patient: [name]  Procedure(s) Performed: COLONOSCOPY WITH PROPOFOL  Patient Location: Endoscopy Unit  Anesthesia Type:General  Level of Consciousness: drowsy  Airway & Oxygen Therapy: Patient Spontanous Breathing  Post-op Assessment: Report given to RN and Post -op Vital signs reviewed and stable  Post vital signs: Reviewed and stable  Last Vitals:  Vitals Value Taken Time  BP 119/70 07/18/21 0931  Temp 35.7 C 07/18/21 0931  Pulse 112 07/18/21 0931  Resp 29 07/18/21 0931  SpO2 98 % 07/18/21 0931    Last Pain:  Vitals:   07/18/21 0931  TempSrc: Temporal  PainSc: Asleep         Complications: No notable events documented.
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
## 2021-07-19 ENCOUNTER — Encounter: Payer: Self-pay | Admitting: Gastroenterology

## 2021-07-20 ENCOUNTER — Encounter: Payer: Self-pay | Admitting: Gastroenterology

## 2021-07-20 LAB — SURGICAL PATHOLOGY

## 2021-07-29 ENCOUNTER — Encounter: Payer: Medicare Other | Admitting: Obstetrics

## 2021-08-18 ENCOUNTER — Ambulatory Visit: Payer: Medicare Other | Admitting: Family Medicine

## 2021-08-21 ENCOUNTER — Other Ambulatory Visit: Payer: Self-pay | Admitting: Family Medicine

## 2021-08-21 DIAGNOSIS — Z862 Personal history of diseases of the blood and blood-forming organs and certain disorders involving the immune mechanism: Secondary | ICD-10-CM

## 2021-08-21 DIAGNOSIS — D649 Anemia, unspecified: Secondary | ICD-10-CM

## 2021-09-19 ENCOUNTER — Ambulatory Visit (INDEPENDENT_AMBULATORY_CARE_PROVIDER_SITE_OTHER): Payer: Medicare Other | Admitting: Obstetrics

## 2021-09-19 ENCOUNTER — Encounter: Payer: Self-pay | Admitting: Obstetrics

## 2021-09-19 ENCOUNTER — Other Ambulatory Visit (HOSPITAL_COMMUNITY)
Admission: RE | Admit: 2021-09-19 | Discharge: 2021-09-19 | Disposition: A | Discharge status: Home / Self Care | Payer: Medicare Other | Source: Ambulatory Visit | Attending: Obstetrics | Admitting: Obstetrics

## 2021-09-19 VITALS — BP 156/94 | HR 108 | Ht 64.0 in | Wt 201.3 lb

## 2021-09-19 DIAGNOSIS — Z1151 Encounter for screening for human papillomavirus (HPV): Secondary | ICD-10-CM | POA: Insufficient documentation

## 2021-09-19 DIAGNOSIS — Z113 Encounter for screening for infections with a predominantly sexual mode of transmission: Secondary | ICD-10-CM

## 2021-09-19 DIAGNOSIS — Z01419 Encounter for gynecological examination (general) (routine) without abnormal findings: Secondary | ICD-10-CM

## 2021-09-19 DIAGNOSIS — Z3042 Encounter for surveillance of injectable contraceptive: Secondary | ICD-10-CM | POA: Insufficient documentation

## 2021-09-19 DIAGNOSIS — Z3202 Encounter for pregnancy test, result negative: Secondary | ICD-10-CM

## 2021-09-19 DIAGNOSIS — Z124 Encounter for screening for malignant neoplasm of cervix: Secondary | ICD-10-CM

## 2021-09-19 MED ORDER — MEDROXYPROGESTERONE ACETATE 150 MG/ML IM SUSP
150.0000 mg | Freq: Once | INTRAMUSCULAR | Status: AC
  Administered 2021-09-19: 150 mg via INTRAMUSCULAR

## 2021-09-19 MED ORDER — MEDROXYPROGESTERONE ACETATE 150 MG/ML IM SUSP: 150.0000 mg | INTRAMUSCULAR | 0 refills | Status: DC

## 2021-09-19 NOTE — Progress Notes (Unsigned)
SUBJECTIVE  HPI  Desiree Gaines is a 36 y.o.-year-old female who presents for an annual gynecological exam and Pap smear today. She reports regular, heavy periods. She denies pelvic pain, unusual vaginal bleeding, discharge, or odor, dyspareunia, and UTI symptoms. She would like to resume the Depo shot today. She does have a distended abdomen and diastasis recti that she has had CT imaging for and is managed by her PCP. She endorses anxiety and depression that she manages by listening to music and going outside. She has not been sexually active for about a month. She desires STI testing today. She is very anxious about the Pap smear.  Medical/Surgical History Past Medical History:  Diagnosis Date   Adrenal nodule ([place])    Anemia    Graves disease    Hypothyroidism    Pituitary tumor    Thyroid disease    Umbilical hernia    Past Surgical History:  Procedure Laterality Date   CESAREAN SECTION  06/2011   x 2   COLONOSCOPY WITH PROPOFOL N/A 07/18/2021   Procedure: COLONOSCOPY WITH PROPOFOL;  Surgeon: Jonathon Bellows, MD;  Location: [facility] ENDOSCOPY;  Service: Gastroenterology;  Laterality: N/A;   tubes in ears Bilateral     Social History Lives with children Work: [employer] Exercise: none Substances: denies EtOH, tobacco, vape, recreational drugs  Obstetric History OB History     Gravida  2   Para  2   Term  1   Preterm  0   AB  0   Living  2      SAB      IAB      Ectopic      Multiple      Live Births  2            GYN/Menstrual History Patient's last menstrual period was 08/08/2021 (approximate). regular periods every month Last Pap: years ago, unsure of date Contraception: Depo  Prevention Dentist: has not been since having 6 teeth removed Eye exam: encouraged to go Mammogram: at 42   Current Medications Outpatient Medications Prior to Visit  Medication Sig   ferrous sulfate 325 (65 FE) MG tablet TAKE 1 TABLET (325 MG)BY MOUTH DAILY   folic  acid (FOLVITE) 1 MG tablet TAKE 1 TABLET BY MOUTH EVERY DAY   levothyroxine (SYNTHROID) 125 MCG tablet Take 1 tablet (125 mcg total) by mouth daily.   No facility-administered medications prior to visit.      [facility] - 09/19/21 1723       Pregnancy Intention Screening   Does the patient want to become pregnant in the next year? No    Does the patient's partner want to become pregnant in the next year? No    Would the patient like to discuss contraceptive options today? Yes      Contraception Wrap Up   Current Method No Contraceptive Precautions    End Method Hormonal Injection    Contraception Counseling Provided Yes            The pregnancy intention screening data noted above was reviewed. Potential methods of contraception were discussed. The patient elected to proceed with Hormonal Injection.   ROS History obtained from the patient General ROS: negative for - chills, fatigue, fever, or hot flashes Psychological ROS: positive for - anxiety and depression Ophthalmic ROS: negative for - blurry vision or decreased vision ENT ROS: negative for - headaches or sore throat Hematological and Lymphatic ROS: negative for - bleeding problems, bruising, or swollen lymph  nodes Endocrine ROS: negative for - breast changes or palpitations Breast ROS: negative for breast lumps Respiratory ROS: no cough, shortness of breath, or wheezing Cardiovascular ROS: no chest pain or dyspnea on exertion Gastrointestinal ROS: no abdominal pain, change in bowel habits, or black or bloody stools Genito-Urinary ROS: no dysuria, trouble voiding, or hematuria Musculoskeletal ROS: negative Dermatological ROS: negative     05/19/2021    9:38 AM 09/22/2020    9:22 AM 08/19/2020    8:24 AM 07/12/2020    2:59 PM  Depression screen PHQ 2/9  Decreased Interest 0 0 0 2  Down, Depressed, Hopeless 1 1 0 2  PHQ - 2 Score 1 1 0 4  Altered sleeping 0 1 0 1  Tired, decreased energy 1 1 0 3  Change in appetite 0  1 0 1  Feeling bad or failure about yourself  0 1 0 1  Trouble concentrating 0 1 0 1  Moving slowly or fidgety/restless 1 0 0 1  Suicidal thoughts 0 0 0 0  PHQ-9 Score 3 6 0 12  Difficult doing work/chores Not difficult at all   Somewhat difficult     OBJECTIVE  Last Weight  Most recent update: 09/19/2021  2:37 PM    Weight  91.3 kg (201 lb 4.8 oz)             Body mass index is 34.55 kg/m.    BP (!) 156/94   Pulse (!) 108   Ht '5\' 4"'$  (1.626 m)   Wt 201 lb 4.8 oz (91.3 kg)   LMP 08/08/2021 (Approximate)   BMI 34.55 kg/m  General appearance: alert, cooperative, and anxious Head: Normocephalic, without obvious abnormality, atraumatic Eyes: negative findings: lids and lashes normal and conjunctivae and sclerae normal Neck: no adenopathy, supple, symmetrical, trachea midline, and thyroid not enlarged, symmetric, no tenderness/mass/nodules Lungs: clear to auscultation bilaterally Breasts: normal appearance, no masses or tenderness, No nipple discharge or bleeding, No axillary or supraclavicular adenopathy, Normal to palpation without dominant masses, Taught monthly breast self examination Heart: regular rate and rhythm, S1, S2 normal, no murmur, click, rub or gallop Abdomen:  soft, distended, mildly tender to palpation on upper right quadrant, lax abdominal muscles Pelvic: external genitalia normal, rectovaginal septum normal, vagina normal without discharge, and difficult to visualize cervix d/t body habitus and patient discomfort. Pap collected. Extremities: extremities normal, atraumatic, no cyanosis or edema Pulses: 2+ and symmetric Skin: mobility and turgor normal Lymph nodes: Cervical, supraclavicular, and axillary nodes normal.  ASSESSMENT  1) Annual exam 2) Pap due 3) Desires contraception  PLAN 1) Physical exam as noted. Discussed healthy lifestyle choices. STI swab and blood work collected. 2) Pap collected. F/u based on results 3) Depo shot given today. Return  in 12 weeks for next injection.  Return in one year for annual exam or as needed for concerns.   Lloyd Huger, CNM

## 2021-09-20 LAB — POCT URINE PREGNANCY: Preg Test, Ur: NEGATIVE

## 2021-09-20 LAB — RPR: RPR Ser Ql: NONREACTIVE

## 2021-09-20 LAB — HIV ANTIBODY (ROUTINE TESTING W REFLEX): HIV Screen 4th Generation wRfx: NONREACTIVE

## 2021-09-20 LAB — HEPATITIS C ANTIBODY: Hep C Virus Ab: NONREACTIVE

## 2021-09-20 LAB — HEPATITIS B SURFACE ANTIBODY,QUALITATIVE: Hep B Surface Ab, Qual: NONREACTIVE

## 2021-09-20 LAB — HEPATITIS B SURFACE ANTIGEN: Hepatitis B Surface Ag: NEGATIVE

## 2021-09-22 LAB — CYTOLOGY - PAP
Adequacy: ABSENT
Chlamydia: NEGATIVE
Comment: NEGATIVE
Comment: NEGATIVE
Comment: NEGATIVE
Comment: NORMAL
Diagnosis: NEGATIVE
High risk HPV: NEGATIVE
Neisseria Gonorrhea: NEGATIVE
Trichomonas: NEGATIVE

## 2021-09-26 ENCOUNTER — Ambulatory Visit: Payer: Medicare Other

## 2021-09-30 ENCOUNTER — Ambulatory Visit: Payer: Medicare Other

## 2021-10-07 ENCOUNTER — Other Ambulatory Visit: Payer: Self-pay | Admitting: Family Medicine

## 2021-10-07 DIAGNOSIS — E039 Hypothyroidism, unspecified: Secondary | ICD-10-CM

## 2021-10-14 ENCOUNTER — Ambulatory Visit: Payer: Medicare Other

## 2021-10-26 ENCOUNTER — Ambulatory Visit: Payer: Medicare Other

## 2021-10-27 ENCOUNTER — Ambulatory Visit (INDEPENDENT_AMBULATORY_CARE_PROVIDER_SITE_OTHER): Payer: Medicare Other | Admitting: Family Medicine

## 2021-10-27 ENCOUNTER — Encounter: Payer: Self-pay | Admitting: Family Medicine

## 2021-10-27 VITALS — BP 130/82 | HR 88 | Ht 64.0 in | Wt 201.0 lb

## 2021-10-27 DIAGNOSIS — Z862 Personal history of diseases of the blood and blood-forming organs and certain disorders involving the immune mechanism: Secondary | ICD-10-CM | POA: Insufficient documentation

## 2021-10-27 DIAGNOSIS — K439 Ventral hernia without obstruction or gangrene: Secondary | ICD-10-CM | POA: Diagnosis not present

## 2021-10-27 DIAGNOSIS — K562 Volvulus: Secondary | ICD-10-CM | POA: Diagnosis not present

## 2021-10-27 DIAGNOSIS — N912 Amenorrhea, unspecified: Secondary | ICD-10-CM | POA: Diagnosis not present

## 2021-10-27 DIAGNOSIS — E039 Hypothyroidism, unspecified: Secondary | ICD-10-CM | POA: Diagnosis not present

## 2021-10-27 DIAGNOSIS — M6208 Separation of muscle (nontraumatic), other site: Secondary | ICD-10-CM | POA: Diagnosis not present

## 2021-10-27 DIAGNOSIS — Z91199 Patient's noncompliance with other medical treatment and regimen due to unspecified reason: Secondary | ICD-10-CM

## 2021-10-27 DIAGNOSIS — D649 Anemia, unspecified: Secondary | ICD-10-CM

## 2021-10-27 LAB — POCT URINE PREGNANCY: Preg Test, Ur: NEGATIVE

## 2021-10-27 MED ORDER — FOLIC ACID 1 MG PO TABS: 1.0000 mg | ORAL_TABLET | Freq: Every day | ORAL | 0 refills | Status: DC

## 2021-10-27 MED ORDER — LEVOTHYROXINE SODIUM 125 MCG PO TABS: 125.0000 ug | ORAL_TABLET | Freq: Every day | ORAL | 0 refills | Status: DC

## 2021-10-27 MED ORDER — FERROUS SULFATE 325 (65 FE) MG PO TABS: ORAL_TABLET | ORAL | 0 refills | Status: DC

## 2021-10-27 NOTE — Progress Notes (Signed)
Date:  10/27/2021   Name:  Desiree Gaines   DOB:  May 27, 1985   MRN:  [id number]   Chief Complaint: Hypothyroidism and Anemia  Anemia Presents for follow-up visit. There has been no abdominal pain, bruising/bleeding easily, fever, palpitations, paresthesias or weight loss. Signs of blood loss that are not present include hematemesis, hematochezia, melena, menorrhagia and vaginal bleeding. There are no compliance problems.     Lab Results  Component Value Date   NA 139 05/19/2021   K 4.5 05/19/2021   CO2 20 05/19/2021   GLUCOSE 99 05/19/2021   BUN 11 05/19/2021   CREATININE 1.17 (H) 05/19/2021   CALCIUM 10.5 (H) 05/19/2021   EGFR 63 05/19/2021   GFRNONAA >60 08/07/2020   Lab Results  Component Value Date   CHOL 173 05/19/2021   HDL 49 05/19/2021   LDLCALC 95 05/19/2021   TRIG 171 (H) 05/19/2021   Lab Results  Component Value Date   TSH 37.600 (H) 07/15/2021   No results found for: "HGBA1C" Lab Results  Component Value Date   WBC 4.8 05/19/2021   HGB 9.8 (L) 05/19/2021   HCT 28.9 (L) 05/19/2021   MCV 96 05/19/2021   PLT 203 05/19/2021   Lab Results  Component Value Date   ALT 25 08/07/2020   AST 16 08/07/2020   ALKPHOS 112 08/07/2020   BILITOT 0.6 08/07/2020   No results found for: "25OHVITD2", "25OHVITD3", "VD25OH"   Review of Systems  Constitutional:  Negative for chills, fever and weight loss.  HENT:  Negative for drooling, ear discharge, ear pain and sore throat.   Respiratory:  Negative for cough, shortness of breath and wheezing.   Cardiovascular:  Negative for chest pain, palpitations and leg swelling.  Gastrointestinal:  Negative for abdominal pain, blood in stool, constipation, diarrhea, hematemesis, hematochezia, melena and nausea.  Endocrine: Negative for polydipsia.  Genitourinary:  Negative for dysuria, frequency, hematuria, menorrhagia, urgency and vaginal bleeding.  Musculoskeletal:  Negative for back pain, myalgias and neck pain.   Skin:  Negative for rash.  Allergic/Immunologic: Negative for environmental allergies.  Neurological:  Negative for dizziness, headaches and paresthesias.  Hematological:  Does not bruise/bleed easily.  Psychiatric/Behavioral:  Negative for suicidal ideas. The patient is not nervous/anxious.     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   Ventral hernia without obstruction or gangrene 08/12/2020   Facial cellulitis 08/02/2020   Hypothyroidism 08/02/2020   Acute posthemorrhagic anemia 07/09/2011   Delivery with history of cesarean section 07/08/2011   Other postprocedural status(V45.89) 07/08/2011   Iron deficiency anemia 03/06/2011   Maternal thyroid dysfunction, antepartum 01/21/2007   Developmental delay 06/19/2000    No Known Allergies  Past Surgical History:  Procedure Laterality Date   CESAREAN SECTION  06/2011   x 2   COLONOSCOPY WITH PROPOFOL N/A 07/18/2021   Procedure: COLONOSCOPY WITH PROPOFOL;  Surgeon: Jonathon Bellows, MD;  Location: [facility] ENDOSCOPY;  Service: Gastroenterology;  Laterality: N/A;   tubes in ears Bilateral     Social History   Tobacco Use   Smoking status: Never    Passive exposure: Never   Smokeless tobacco: Never  Vaping Use   Vaping Use: Never used  Substance Use Topics   Alcohol use: Not Currently   Drug use: Never     Medication list has been reviewed and updated.  Current Meds  Medication Sig  cyanocobalamin (VITAMIN B12) 1000 MCG tablet Take 1,000 mcg by mouth daily. Gummy otc   levothyroxine (SYNTHROID) 125 MCG tablet TAKE 1 TABLET BY MOUTH EVERY DAY   medroxyPROGESTERone (DEPO-PROVERA) 150 MG/ML injection Inject 150 mg into the muscle every 3 (three) months.       10/27/2021    2:57 PM 05/19/2021    9:39 AM 08/19/2020    8:24 AM 07/12/2020    3:00 PM  GAD 7 : Generalized Anxiety Score  Nervous,  Anxious, on Edge 0 1 0 3  Control/stop worrying 0 1 0 2  Worry too much - different things 0 1 0 2  Trouble relaxing 0 0 0 1  Restless 0 0 0 1  Easily annoyed or irritable 0 0 0 1  Afraid - awful might happen 0 0 0 1  Total GAD 7 Score 0 3 0 11  Anxiety Difficulty Not difficult at all Not difficult at all  Not difficult at all       10/27/2021    2:57 PM 05/19/2021    9:38 AM 09/22/2020    9:22 AM  Depression screen PHQ 2/9  Decreased Interest 0 0 0  Down, Depressed, Hopeless 0 1 1  PHQ - 2 Score 0 1 1  Altered sleeping 0 0 1  Tired, decreased energy 0 1 1  Change in appetite 0 0 1  Feeling bad or failure about yourself  0 0 1  Trouble concentrating 0 0 1  Moving slowly or fidgety/restless 0 1 0  Suicidal thoughts 0 0 0  PHQ-9 Score 0 3 6  Difficult doing work/chores Not difficult at all Not difficult at all     BP Readings from Last 3 Encounters:  10/27/21 130/82  09/19/21 (!) 156/94  07/18/21 140/80    Physical Exam Vitals and nursing note reviewed.  Constitutional:      Appearance: She is well-developed.  HENT:     Head: Normocephalic.     Right Ear: Tympanic membrane, ear canal and external ear normal.     Left Ear: Tympanic membrane, ear canal and external ear normal.     Nose: No congestion or rhinorrhea.  Eyes:     General: Lids are everted, no foreign bodies appreciated. No scleral icterus.       Left eye: No foreign body or hordeolum.     Conjunctiva/sclera: Conjunctivae normal.     Right eye: Right conjunctiva is not injected.     Left eye: Left conjunctiva is not injected.     Pupils: Pupils are equal, round, and reactive to light.  Neck:     Thyroid: No thyromegaly.     Vascular: No JVD.     Trachea: No tracheal deviation.  Cardiovascular:     Rate and Rhythm: Normal rate and regular rhythm.     Heart sounds: Normal heart sounds. No murmur heard.    No friction rub. No gallop.  Pulmonary:     Effort: Pulmonary effort is normal. No respiratory  distress.     Breath sounds: Normal breath sounds. No wheezing, rhonchi or rales.  Abdominal:     General: Abdomen is protuberant. Bowel sounds are normal. There is no distension or abdominal bruit. There are no signs of injury.     Palpations: Abdomen is soft. There is no hepatomegaly, splenomegaly, mass or pulsatile mass.     Tenderness: There is no abdominal tenderness. There is no guarding or rebound.     Hernia: A hernia is present.  Hernia is present in the ventral area.  Musculoskeletal:        General: No tenderness. Normal range of motion.     Cervical back: Normal range of motion and neck supple.  Lymphadenopathy:     Cervical: No cervical adenopathy.  Skin:    General: Skin is warm.     Findings: No rash.  Neurological:     Mental Status: She is alert and oriented to person, place, and time.     Cranial Nerves: No cranial nerve deficit.     Deep Tendon Reflexes: Reflexes normal.  Psychiatric:        Mood and Affect: Mood is not anxious or depressed.     Wt Readings from Last 3 Encounters:  10/27/21 201 lb (91.2 kg)  09/19/21 201 lb 4.8 oz (91.3 kg)  07/18/21 215 lb (97.5 kg)    BP 130/82   Pulse 88   Ht '5\' 4"'  (1.626 m)   Wt 201 lb (91.2 kg)   LMP 10/26/2021 (Exact Date)   BMI 34.50 kg/m   Assessment and Plan: 1. Acquired hypothyroidism Chronic.  Uncontrolled.  Likely due to noncompliance not taking medication on a daily basis.  We will refill levothyroxine and has been absolutely encouraged to take on a daily basis. - levothyroxine (SYNTHROID) 125 MCG tablet; Take 1 tablet (125 mcg total) by mouth daily.  Dispense: 90 tablet; Refill: 0  2. History of anemia Chronic.  Uncontrolled.  Likely due to noncompliance of taking medication.  Will check hemoglobin on next visit in the meantime reemphasized to take folic acid 1 mg daily and ferrous sulfate 325 mg 1 tablet daily. - folic acid (FOLVITE) 1 MG tablet; Take 1 tablet (1 mg total) by mouth daily.  Dispense: 90  tablet; Refill: 0 - ferrous sulfate 325 (65 FE) MG tablet; TAKE 1 TABLET (325 MG)BY MOUTH DAILY  Dispense: 90 tablet; Refill: 0  3. Low hemoglobin As noted above - folic acid (FOLVITE) 1 MG tablet; Take 1 tablet (1 mg total) by mouth daily.  Dispense: 90 tablet; Refill: 0 - ferrous sulfate 325 (65 FE) MG tablet; TAKE 1 TABLET (325 MG)BY MOUTH DAILY  Dispense: 90 tablet; Refill: 0  4. Amenorrhea Patient is amenorrhea but has had some bleeding we will check a urine pregnancy test for assurance of known pregnancy and patient is on Depo and is not due at this time for reinjection. - POCT urine pregnancy  5. 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 Milas Gain to GI for evaluation.  Volvulus was not seen on colonoscopy but that does not mean that it may not of return.  I do not think there is a volvulus and if it did it may be due to not taking her thyroid medication.  Patient has been told must take thyroid medication we will recheck in 6 weeks and if there is still some concern we will repeat a CT scan.  6. Ventral hernia without obstruction or gangrene Patient has a ventral hernia with separation of of the rectus abdominis which is given the appearance of a protuberant abdomen.  7. Diastasis of rectus abdominis Patient has seen Dr.Rodenberg  in the past and we are in the process of reevaluating current diathesis without gangrene without obstruction.  Patient has been reemphasized again she must take her medications we will recheck her in at that time determine whether or not we need to have a repeat  surgical consult as to future progression of treatment of diathesis.  8. Noncompliance Patient admits to taking medications but "not every day "this has been an ongoing problem and we have discussed this with caretaker patient will have a pillbox and will have an alarm to take her medicine every day and this will be monitored until  I recheck at which time we will do CBC and TSH.    Otilio Miu, MD

## 2021-12-05 ENCOUNTER — Ambulatory Visit: Payer: Medicare Other

## 2021-12-08 ENCOUNTER — Ambulatory Visit: Payer: Medicare Other | Admitting: Family Medicine

## 2021-12-14 ENCOUNTER — Ambulatory Visit (INDEPENDENT_AMBULATORY_CARE_PROVIDER_SITE_OTHER): Payer: Medicare Other

## 2021-12-14 VITALS — BP 131/83 | HR 107 | Wt 194.0 lb

## 2021-12-14 DIAGNOSIS — Z3042 Encounter for surveillance of injectable contraceptive: Secondary | ICD-10-CM

## 2021-12-14 MED ORDER — MEDROXYPROGESTERONE ACETATE 150 MG/ML IM SUSP
150.0000 mg | Freq: Once | INTRAMUSCULAR | Status: AC
  Administered 2021-12-14: 150 mg via INTRAMUSCULAR

## 2021-12-14 NOTE — Progress Notes (Cosign Needed Addendum)
Last Depo-Provera: 09/19/2021. Side Effects if any: N/A. Serum HCG indicated? N/A. Depo-Provera 150 mg IM given by: Otelia Limes, CMA. Next appointment due January 24- February 7.   [facility]- [id number] Lot # [id number] Exp: 05/07/2023

## 2021-12-15 ENCOUNTER — Ambulatory Visit: Payer: Medicare Other | Admitting: Family Medicine

## 2021-12-16 ENCOUNTER — Ambulatory Visit: Payer: Medicare Other

## 2021-12-17 ENCOUNTER — Other Ambulatory Visit: Payer: Self-pay | Admitting: Obstetrics

## 2021-12-17 DIAGNOSIS — Z3042 Encounter for surveillance of injectable contraceptive: Secondary | ICD-10-CM

## 2021-12-19 NOTE — Telephone Encounter (Signed)
Patient no longer needs a prescription for Depo it's in stock in office.

## 2021-12-26 ENCOUNTER — Encounter: Payer: Self-pay | Admitting: Family Medicine

## 2021-12-26 ENCOUNTER — Ambulatory Visit (INDEPENDENT_AMBULATORY_CARE_PROVIDER_SITE_OTHER): Payer: Medicare Other | Admitting: Family Medicine

## 2021-12-26 VITALS — BP 120/76 | HR 118 | Ht 64.0 in | Wt 193.0 lb

## 2021-12-26 DIAGNOSIS — E039 Hypothyroidism, unspecified: Secondary | ICD-10-CM

## 2021-12-26 MED ORDER — LEVOTHYROXINE SODIUM 125 MCG PO TABS: 125.0000 ug | ORAL_TABLET | Freq: Every day | ORAL | 0 refills | Status: DC

## 2021-12-26 NOTE — Progress Notes (Signed)
Date:  12/26/2021   Name:  Desiree Gaines   DOB:  10-12-1985   MRN:  [id number]   Chief Complaint: Hypothyroidism  Thyroid Problem Presents for follow-up visit. Symptoms include cold intolerance, hair loss and palpitations. Patient reports no anxiety, constipation, diarrhea, dry skin, fatigue, heat intolerance, hoarse voice, leg swelling, menstrual problem, nail problem, visual change, weight gain or weight loss.    Lab Results  Component Value Date   NA 139 05/19/2021   K 4.5 05/19/2021   CO2 20 05/19/2021   GLUCOSE 99 05/19/2021   BUN 11 05/19/2021   CREATININE 1.17 (H) 05/19/2021   CALCIUM 10.5 (H) 05/19/2021   EGFR 63 05/19/2021   GFRNONAA >60 08/07/2020   Lab Results  Component Value Date   CHOL 173 05/19/2021   HDL 49 05/19/2021   LDLCALC 95 05/19/2021   TRIG 171 (H) 05/19/2021   Lab Results  Component Value Date   TSH 37.600 (H) 07/15/2021   No results found for: "HGBA1C" Lab Results  Component Value Date   WBC 4.8 05/19/2021   HGB 9.8 (L) 05/19/2021   HCT 28.9 (L) 05/19/2021   MCV 96 05/19/2021   PLT 203 05/19/2021   Lab Results  Component Value Date   ALT 25 08/07/2020   AST 16 08/07/2020   ALKPHOS 112 08/07/2020   BILITOT 0.6 08/07/2020   No results found for: "25OHVITD2", "25OHVITD3", "VD25OH"   Review of Systems  Constitutional: Negative.  Negative for chills, fatigue, fever, unexpected weight change, weight gain and weight loss.  HENT:  Negative for congestion, ear discharge, ear pain, hoarse voice, rhinorrhea, sinus pressure, sneezing and sore throat.   Respiratory:  Negative for cough, shortness of breath, wheezing and stridor.   Cardiovascular:  Positive for palpitations.  Gastrointestinal:  Negative for abdominal pain, blood in stool, constipation, diarrhea and nausea.  Endocrine: Positive for cold intolerance. Negative for heat intolerance.  Genitourinary:  Negative for dysuria, flank pain, frequency, hematuria, menstrual problem,  urgency and vaginal discharge.  Musculoskeletal:  Negative for arthralgias, back pain and myalgias.  Skin:  Negative for rash.  Neurological:  Negative for dizziness, weakness and headaches.  Hematological:  Negative for adenopathy. Does not bruise/bleed easily.  Psychiatric/Behavioral:  Negative for dysphoric mood. The patient is not nervous/anxious.     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   Ventral hernia without obstruction or gangrene 08/12/2020   Facial cellulitis 08/02/2020   Hypothyroidism 08/02/2020   Acute posthemorrhagic anemia 07/09/2011   Delivery with history of cesarean section 07/08/2011   Other postprocedural status(V45.89) 07/08/2011   Iron deficiency anemia 03/06/2011   Maternal thyroid dysfunction, antepartum 01/21/2007   Developmental delay 06/19/2000    No Known Allergies  Past Surgical History:  Procedure Laterality Date   CESAREAN SECTION  06/2011   x 2   COLONOSCOPY WITH PROPOFOL N/A 07/18/2021   Procedure: COLONOSCOPY WITH PROPOFOL;  Surgeon: Jonathon Bellows, MD;  Location: [facility] ENDOSCOPY;  Service: Gastroenterology;  Laterality: N/A;   tubes in ears Bilateral     Social History   Tobacco Use   Smoking status: Never    Passive exposure: Never   Smokeless tobacco: Never  Vaping Use   Vaping Use: Never used  Substance Use Topics   Alcohol use: Not Currently   Drug  use: Never     Medication list has been reviewed and updated.  Current Meds  Medication Sig   cyanocobalamin (VITAMIN B12) 1000 MCG tablet Take 1,000 mcg by mouth daily. Gummy otc   levothyroxine (SYNTHROID) 125 MCG tablet Take 1 tablet (125 mcg total) by mouth daily.   medroxyPROGESTERone (DEPO-PROVERA) 150 MG/ML injection Inject 150 mg into the muscle every 3 (three) months.        12/26/2021    3:48 PM 10/27/2021    2:57 PM 05/19/2021    9:39 AM 08/19/2020    8:24 AM  GAD 7 : Generalized Anxiety Score  Nervous, Anxious, on Edge 0 0 1 0  Control/stop worrying 0 0 1 0  Worry too much - different things 0 0 1 0  Trouble relaxing 0 0 0 0  Restless 0 0 0 0  Easily annoyed or irritable 0 0 0 0  Afraid - awful might happen 0 0 0 0  Total GAD 7 Score 0 0 3 0  Anxiety Difficulty Not difficult at all Not difficult at all Not difficult at all        12/26/2021    3:47 PM 10/27/2021    2:57 PM 05/19/2021    9:38 AM  Depression screen PHQ 2/9  Decreased Interest 0 0 0  Down, Depressed, Hopeless 0 0 1  PHQ - 2 Score 0 0 1  Altered sleeping 0 0 0  Tired, decreased energy 0 0 1  Change in appetite 0 0 0  Feeling bad or failure about yourself  0 0 0  Trouble concentrating 0 0 0  Moving slowly or fidgety/restless 0 0 1  Suicidal thoughts 0 0 0  PHQ-9 Score 0 0 3  Difficult doing work/chores Not difficult at all Not difficult at all Not difficult at all    BP Readings from Last 3 Encounters:  12/26/21 120/76  12/14/21 131/83  10/27/21 130/82    Physical Exam Vitals and nursing note reviewed. Exam conducted with a chaperone present.  Constitutional:      General: She is not in acute distress.    Appearance: She is not diaphoretic.  HENT:     Head: Normocephalic and atraumatic.     Right Ear: Tympanic membrane and external ear normal.     Left Ear: Tympanic membrane and external ear normal.     Nose: Nose normal. No congestion or rhinorrhea.  Eyes:     General:        Right eye: No discharge.        Left eye: No discharge.     Conjunctiva/sclera: Conjunctivae normal.     Pupils: Pupils are equal, round, and reactive to light.  Neck:     Thyroid: No thyroid mass, thyromegaly or thyroid tenderness.     Vascular: No JVD.  Cardiovascular:     Rate and Rhythm: Normal rate and regular rhythm.     Heart sounds: Normal heart sounds. No murmur heard.     No friction rub. No gallop.  Pulmonary:     Effort: Pulmonary effort is normal.     Breath sounds: Normal breath sounds. No wheezing, rhonchi or rales.  Abdominal:     General: Bowel sounds are normal.     Palpations: Abdomen is soft. There is no mass.     Tenderness: There is no abdominal tenderness. There is no guarding or rebound.  Musculoskeletal:        General: Normal range of motion.  Cervical back: Normal range of motion and neck supple.  Lymphadenopathy:     Cervical: No cervical adenopathy.  Skin:    General: Skin is warm and dry.  Neurological:     Mental Status: She is alert.     Deep Tendon Reflexes: Reflexes are normal and symmetric.     Wt Readings from Last 3 Encounters:  12/26/21 193 lb (87.5 kg)  12/14/21 194 lb (88 kg)  10/27/21 201 lb (91.2 kg)    BP 120/76   Pulse (!) 118   Ht _0  (1.626 m)   Wt 193 lb (87.5 kg)   LMP 12/16/2021 (Approximate)   SpO2 98%   BMI 33.13 kg/m   Assessment and Plan:  1. Acquired hypothyroidism Chronic.  Controlled.  Stable.  Currently is on 125 mcg daily.  But we will check TSH with panel and if in normal range resume at this dosing if not adjust accordingly. - Thyroid Panel With TSH    Otilio Miu, MD

## 2021-12-27 ENCOUNTER — Other Ambulatory Visit: Payer: Self-pay

## 2021-12-27 DIAGNOSIS — E039 Hypothyroidism, unspecified: Secondary | ICD-10-CM

## 2021-12-27 LAB — THYROID PANEL WITH TSH
Free Thyroxine Index: 1.9 (ref 1.2–4.9)
T3 Uptake Ratio: 22 % — ABNORMAL LOW (ref 24–39)
T4, Total: 8.6 ug/dL (ref 4.5–12.0)
TSH: 4.31 u[IU]/mL (ref 0.450–4.500)

## 2021-12-27 MED ORDER — LEVOTHYROXINE SODIUM 125 MCG PO TABS: 125.0000 ug | ORAL_TABLET | Freq: Every day | ORAL | 1 refills | Status: DC

## 2022-02-27 IMAGING — CT CT ABD-PELV W/ CM
2 of 4 series · 15 of 46 positions shown, 17 images · IV contrast (omnipaque)
Comparison: None.

CLINICAL DATA: Five years of abdominal distension and bloating.

EXAM:
CT ABDOMEN AND PELVIS WITH CONTRAST
TECHNIQUE: Multidetector CT imaging of the abdomen and pelvis was performed
using the standard protocol following bolus administration of
intravenous contrast.
CONTRAST:  100mL OMNIPAQUE IOHEXOL 350 MG/ML SOLN

[Series 2: abd pelvis 5.00 · axial · 0.66mm/px · z∈[-1519,-1109]mm · 12 of 94 slices shown, 14 images]
[im 8/94  soft-tissue]
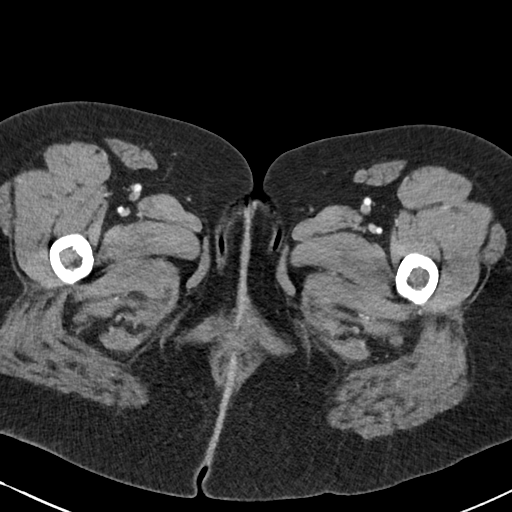
[im 8/94  bone]
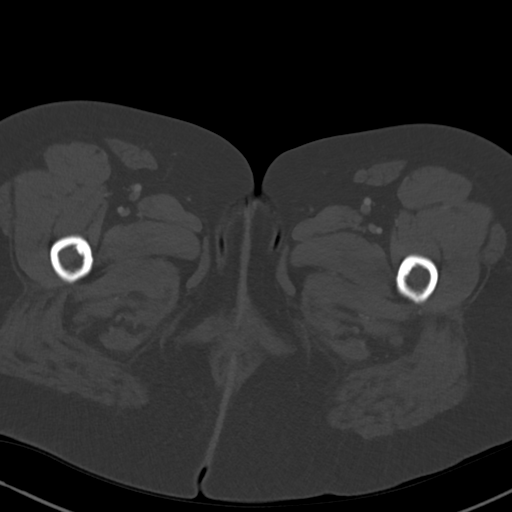
[im 15/94  soft-tissue]
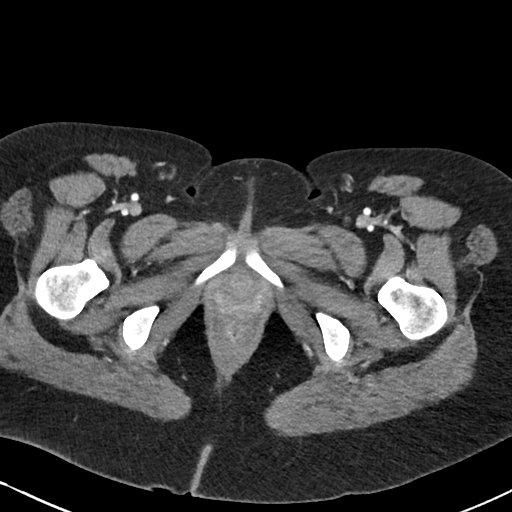
[im 23/94  soft-tissue]
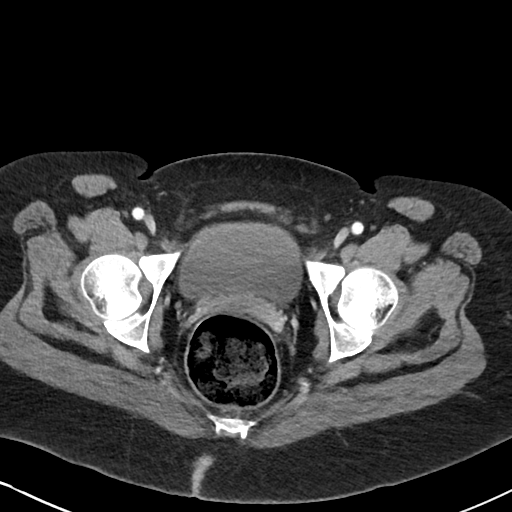
[im 30/94  soft-tissue]
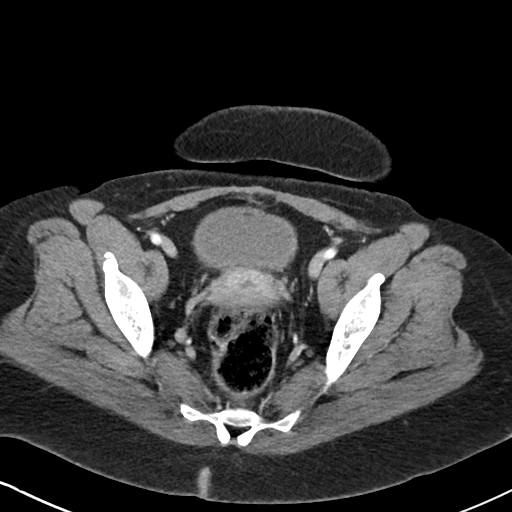
[im 38/94  soft-tissue]
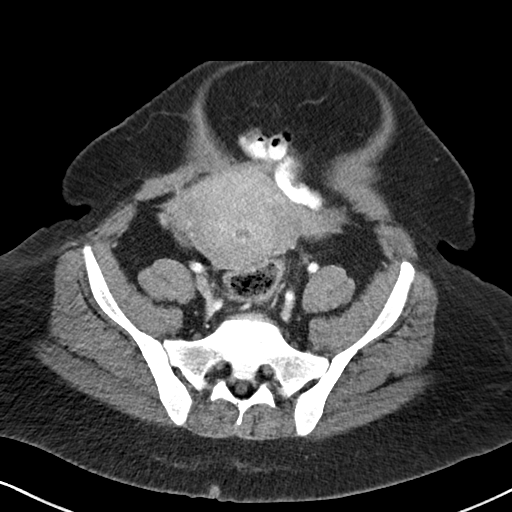
[im 45/94  soft-tissue]
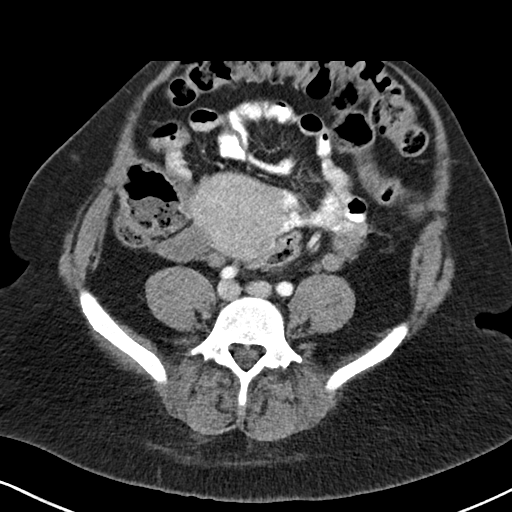
[im 53/94  soft-tissue]
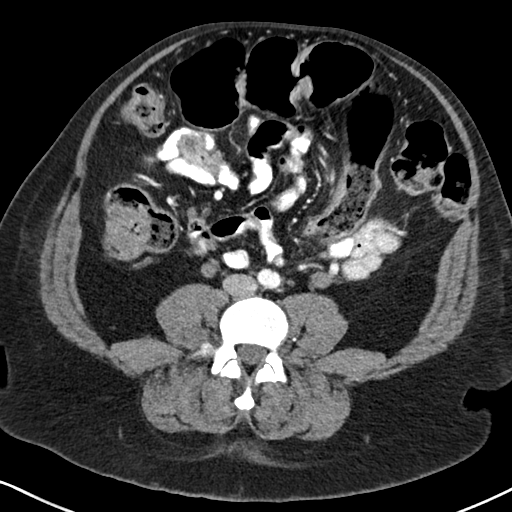
[im 60/94  soft-tissue]
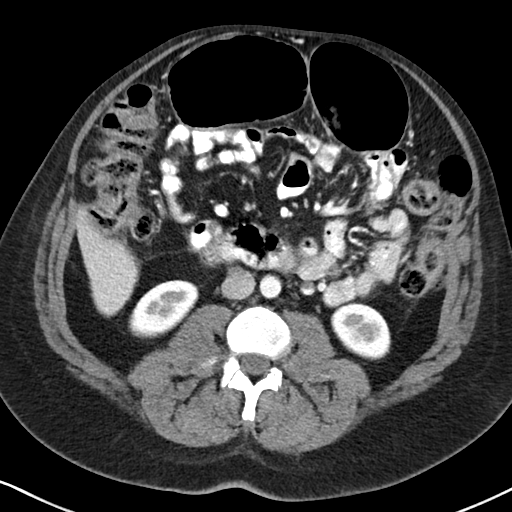
[im 67/94  soft-tissue]
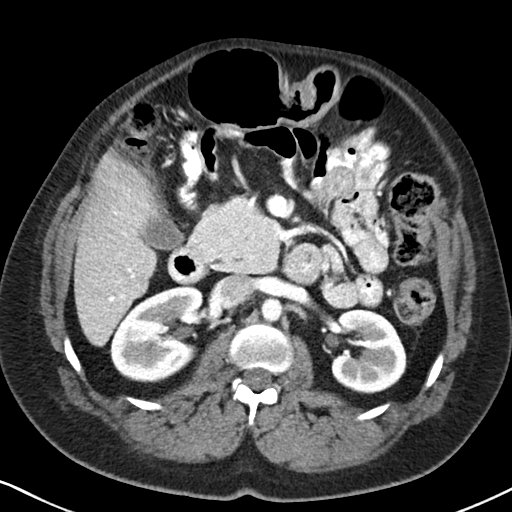
[im 67/94  bone]
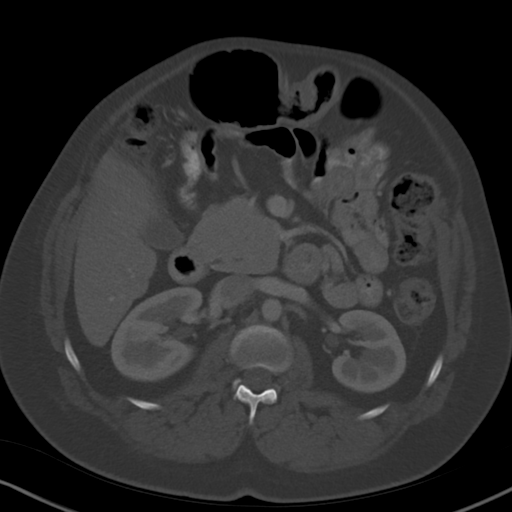
[im 75/94  soft-tissue]
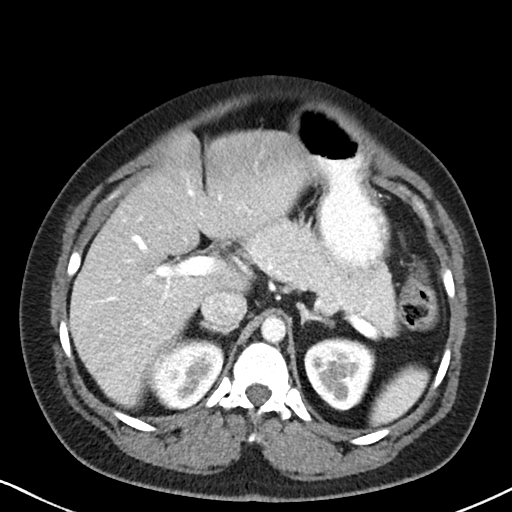
[im 82/94  soft-tissue]
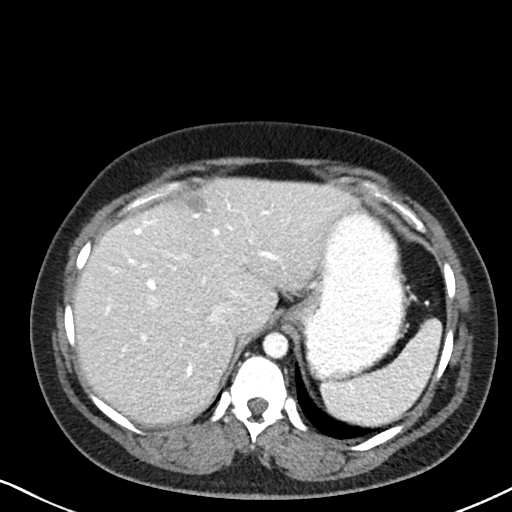
[im 90/94  soft-tissue]
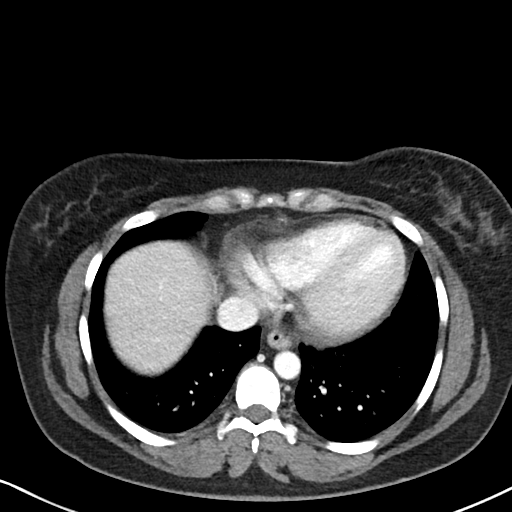

[Series 4: coronals abd pelvis 2.00 cor · coronal · 0.66mm/px · 3 of 170 slices shown]
[im 57/170  soft-tissue]
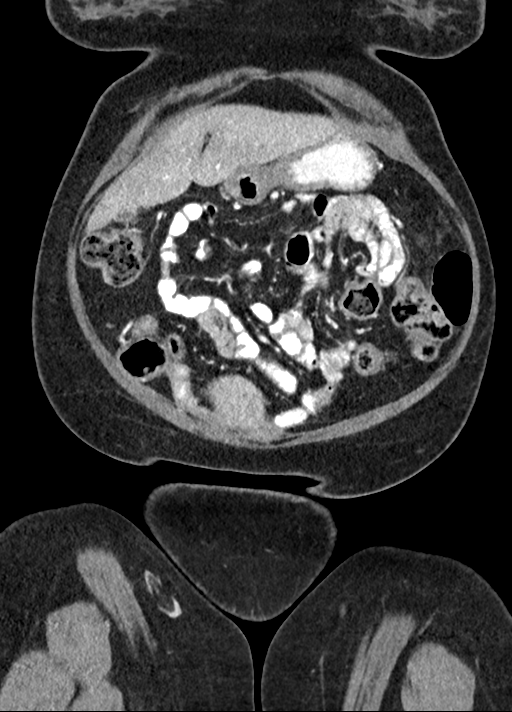
[im 76/170  soft-tissue]
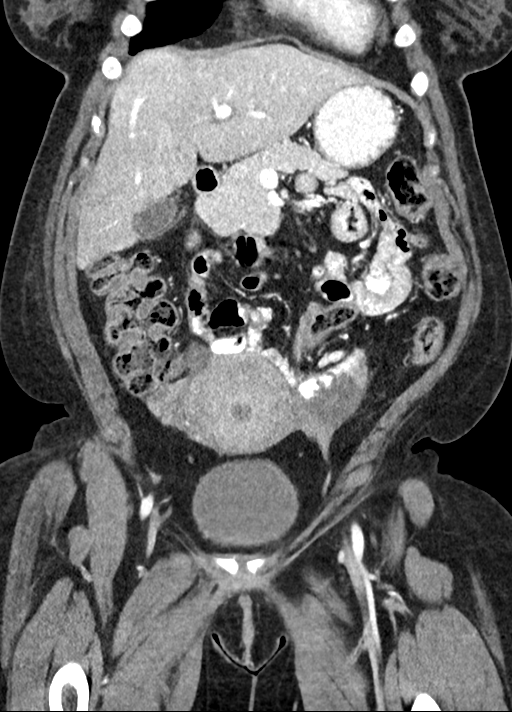
[im 94/170  soft-tissue]
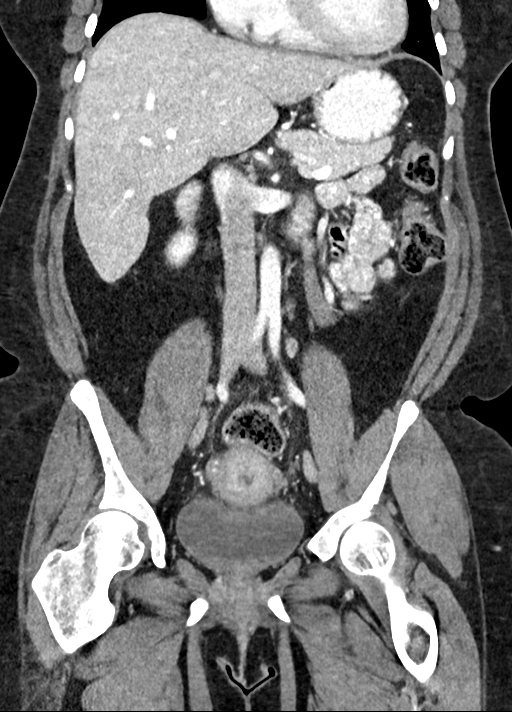

[15 of 46 positions shown; findings below may reference images not displayed]

FINDINGS: Lower chest: Hypoventilatory change in the dependent lungs. Nodular
consolidative focus in the right lower lobe favor infectious or
inflammatory.

Hepatobiliary: No suspicious hepatic lesion. Gallbladder is
unremarkable. No biliary ductal dilation.

Pancreas: Within normal limits.

Spleen: Within normal limits.

Adrenals/Urinary Tract: Right adrenal glands unremarkable. Enhancing
13 mm left adrenal nodule. Kidneys are normal, without renal
calculi, solid enhancing lesion, or hydronephrosis. Bladder is
unremarkable.

Stomach/Bowel: Radiopaque enteric contrast traverses the distal
small bowel. Stomach is unremarkable. No pathologic dilation of
small bowel. The appendix is not definitely visualized however there
is no pericecal inflammation. No suspicious colonic wall thickening
or mass like lesions. Sigmoid colon is rotated, best visualized on
axial image [id number], with majority of the sigmoid colon present in the
anterior upper abdomen superior in location to the transverse colon
with dilation of the sigmoid colon measuring up to 7.3 cm. However,
there is stool and gas visualized to the level of the rectum. No
evidence of sigmoid colonic ischemia.

Vascular/Lymphatic: No abdominal aortic aneurysm. No pathologically
enlarged abdominal or pelvic lymph nodes.

Reproductive: Uterus is within normal limits for patient's age and
history of C-section. Bilateral simple appearing ovarian cysts
measuring up to 2.1 cm on the left, likely physiologic. No follow-up
imaging recommended. Note: This recommendation does not apply to
premenarchal patients and to those with increased risk (genetic,
family history, elevated tumor markers or other high-risk factors)
of ovarian cancer. Reference: JACR [DATE]):[id number]

Other: No abdominopelvic ascites.  Diastasis rectus.

Musculoskeletal: No acute osseous abnormality.
IMPRESSION: 1. Sigmoid colon is rotated and dilated measuring up to 7.3 cm, with
the majority of the sigmoid colon present in the anterior upper
abdomen, but with stool and gas visualized to the level of the
rectum. Findings suggestive of sigmoid volvulus without high-grade
obstruction or evidence of ischemia.
2. Diastasis rectus.
3. Indeterminate enhancing 13 mm left adrenal nodule, consider
further evaluation with nonemergent adrenal protocol MRI or CT.

## 2022-03-08 ENCOUNTER — Ambulatory Visit (INDEPENDENT_AMBULATORY_CARE_PROVIDER_SITE_OTHER): Payer: 59

## 2022-03-08 VITALS — BP 128/84 | Wt 194.0 lb

## 2022-03-08 DIAGNOSIS — Z3042 Encounter for surveillance of injectable contraceptive: Secondary | ICD-10-CM | POA: Diagnosis not present

## 2022-03-08 MED ORDER — MEDROXYPROGESTERONE ACETATE 150 MG/ML IM SUSP
150.0000 mg | Freq: Once | INTRAMUSCULAR | Status: AC
  Administered 2022-03-08: 150 mg via INTRAMUSCULAR

## 2022-03-08 NOTE — Patient Instructions (Signed)

## 2022-03-08 NOTE — Progress Notes (Cosign Needed Addendum)
    NURSE VISIT NOTE  Subjective:    Patient ID: Desiree Gaines, female    DOB: 11-20-1985, 37 y.o.   MRN: [id number]  HPI  Patient is a 37 y.o. G65P1002 female who presents for depo provera injection.   Objective:    There were no vitals taken for this visit.  Last Annual: 09/19/2021. Last pap: 09/19/2021. Last Depo-Provera: 12/14/2021. Side Effects if any: none. Serum HCG indicated? No . Depo-Provera 150 mg IM given by: Cleophas Dunker, CMA. Site: Right Upper [place]er [place]  Lab Review  '@THIS'$  VISIT ONLY@  Assessment:   No diagnosis found.   Plan:   Next appointment due between 05/24/22 and 06/07/2022   Cleophas Dunker, [place]

## 2022-04-27 ENCOUNTER — Ambulatory Visit (INDEPENDENT_AMBULATORY_CARE_PROVIDER_SITE_OTHER): Payer: 59 | Admitting: Family Medicine

## 2022-04-27 ENCOUNTER — Ambulatory Visit (INDEPENDENT_AMBULATORY_CARE_PROVIDER_SITE_OTHER): Payer: 59

## 2022-04-27 ENCOUNTER — Encounter: Payer: Self-pay | Admitting: Family Medicine

## 2022-04-27 VITALS — BP 120/78 | HR 100 | Ht 64.0 in | Wt 193.0 lb

## 2022-04-27 VITALS — Ht 64.0 in | Wt 193.0 lb

## 2022-04-27 DIAGNOSIS — E039 Hypothyroidism, unspecified: Secondary | ICD-10-CM | POA: Diagnosis not present

## 2022-04-27 DIAGNOSIS — Z862 Personal history of diseases of the blood and blood-forming organs and certain disorders involving the immune mechanism: Secondary | ICD-10-CM | POA: Diagnosis not present

## 2022-04-27 DIAGNOSIS — Z0000 Encounter for general adult medical examination without abnormal findings: Secondary | ICD-10-CM | POA: Diagnosis not present

## 2022-04-27 MED ORDER — LEVOTHYROXINE SODIUM 125 MCG PO TABS: 125.0000 ug | ORAL_TABLET | Freq: Every day | ORAL | 1 refills | Status: DC

## 2022-04-27 NOTE — Progress Notes (Signed)
I connected with  [place] on 04/27/22 by a audio enabled telemedicine application and verified that I am speaking with the correct person using two identifiers.  Patient Location: Home  Provider Location: Office/Clinic  I discussed the limitations of evaluation and management by telemedicine. The patient expressed understanding and agreed to proceed.  Subjective:   Desiree Gaines is a 37 y.o. female who presents for Medicare Annual (Subsequent) preventive examination.  Review of Systems     Cardiac Risk Factors include: advanced age (>5men, >90 women)     Objective:    There were no vitals filed for this visit. There is no height or weight on file to calculate BMI.     04/27/2022    9:15 AM 07/18/2021    8:37 AM 09/22/2020    9:29 AM 08/02/2020    4:32 AM  Advanced Directives  Does Patient Have a Medical Advance Directive? No Yes Yes No  Type of Advance Directive  Healthcare Power of [place] in Chart?   No - copy requested   Would patient like information on creating a medical advance directive? No - Patient declined   No - Patient declined    Current Medications (verified) Outpatient Encounter Medications as of 04/27/2022  Medication Sig   cyanocobalamin (VITAMIN B12) 1000 MCG tablet Take 1,000 mcg by mouth daily. Gummy otc   ferrous sulfate 325 (65 FE) MG tablet TAKE 1 TABLET (325 MG)BY MOUTH DAILY   levothyroxine (SYNTHROID) 125 MCG tablet Take 1 tablet (125 mcg total) by mouth daily.   medroxyPROGESTERone (DEPO-PROVERA) 150 MG/ML injection Inject 150 mg into the muscle every 3 (three) months.   folic acid (FOLVITE) 1 MG tablet Take 1 tablet (1 mg total) by mouth daily. (Patient not taking: Reported on 12/26/2021)   No facility-administered encounter medications on file as of 04/27/2022.    Allergies (verified) Patient has no known allergies.   History: Past Medical History:   Diagnosis Date   Adrenal nodule ([place])    Anemia    Graves disease    Hypothyroidism    Pituitary tumor    Thyroid disease    Umbilical hernia    Past Surgical History:  Procedure Laterality Date   CESAREAN SECTION  06/2011   x 2   COLONOSCOPY WITH PROPOFOL N/A 07/18/2021   Procedure: COLONOSCOPY WITH PROPOFOL;  Surgeon: Jonathon Bellows, MD;  Location: [facility] ENDOSCOPY;  Service: Gastroenterology;  Laterality: N/A;   tubes in ears Bilateral    Family History  Problem Relation Age of Onset   Diabetes Mother    Hypertension Mother    Stroke Mother    Hypertension Sister    Breast cancer Sister    Heart disease Brother    Social History   Socioeconomic History   Marital status: Single    Spouse name: Not on file   Number of children: 2   Years of education: Not on file   Highest education level: High school graduate  Occupational History   Not on file  Tobacco Use   Smoking status: Never    Passive exposure: Never   Smokeless tobacco: Never  Vaping Use   Vaping Use: Never used  Substance and Sexual Activity   Alcohol use: Not Currently   Drug use: Never   Sexual activity: Yes    Birth control/protection: None  Other Topics Concern   Not on file  Social History Narrative   Pt  has 2 children that live with her   Social Determinants of Health   Financial Resource Strain: Low Risk  (04/27/2022)   Overall Financial Resource Strain (CARDIA)    Difficulty of Paying Living Expenses: Not hard at all  Food Insecurity: No Food Insecurity (04/27/2022)   Hunger Vital Sign    Worried About Running Out of Food in the Last Year: Never true    Ran Out of Food in the Last Year: Never true  Transportation Needs: No Transportation Needs (04/27/2022)   PRAPARE - Hydrologist (Medical): No    Lack of Transportation (Non-Medical): No  Physical Activity: Insufficiently Active (04/27/2022)   Exercise Vital Sign    Days of Exercise per Week: 1 day    Minutes  of Exercise per Session: 60 min  Stress: No Stress Concern Present (04/27/2022)   [place]    Feeling of Stress : Not at all  Social Connections: Moderately Isolated (04/27/2022)   Social Connection and Isolation Panel [NHANES]    Frequency of Communication with Friends and Family: More than three times a week    Frequency of Social Gatherings with Friends and Family: Never    Attends Religious Services: More than 4 times per year    Active Member of [employer] or Organizations: No    Attends Music therapist: Never    Marital Status: Never married    Tobacco Counseling Counseling given: Not Answered   Clinical Intake:  Pre-visit preparation completed: Yes  Pain : No/denies pain     Nutritional Risks: None Diabetes: No  How often do you need to have someone help you when you read instructions, pamphlets, or other written materials from your doctor or pharmacy?: 1 - Never  Diabetic?no  Interpreter Needed?: No  Information entered by :: Kirke Shaggy, LPN   Activities of Daily Living    04/27/2022    9:15 AM  In your present state of health, do you have any difficulty performing the following activities:  Hearing? 0  Vision? 0  Difficulty concentrating or making decisions? 0  Walking or climbing stairs? 0  Dressing or bathing? 0  Doing errands, shopping? 0  Preparing Food and eating ? N  Using the Toilet? N  In the past six months, have you accidently leaked urine? N  Do you have problems with loss of bowel control? N  Managing your Medications? N  Managing your Finances? N  Housekeeping or managing your Housekeeping? N    Patient Care Team: Juline Patch, MD as PCP - General (Family Medicine) Rebekah Chesterfield, LCSW as Social Worker (Licensed Clinical Social Worker)  Indicate any recent [employer] you may have received from other than [facility] providers in the past year (date may  be approximate).     Assessment:   This is a routine wellness examination for [facility].  Hearing/Vision screen Hearing Screening - Comments:: No aids Vision Screening - Comments:: No glasses  Dietary issues and exercise activities discussed: Current Exercise Habits: Home exercise routine, Type of exercise: treadmill, Time (Minutes): 60, Frequency (Times/Week): 1, Weekly Exercise (Minutes/Week): 60, Intensity: Mild   Goals Addressed             This Visit's Progress    DIET - EAT MORE FRUITS AND VEGETABLES         Depression Screen    04/27/2022    9:13 AM 12/26/2021    3:47 PM 10/27/2021  2:57 PM 05/19/2021    9:38 AM 09/22/2020    9:22 AM 08/19/2020    8:24 AM 07/12/2020    2:59 PM  PHQ 2/9 Scores  PHQ - 2 Score 0 0 0 1 1 0 4  PHQ- 9 Score 0 0 0 3 6 0 12    Fall Risk    04/27/2022    9:15 AM 12/26/2021    3:47 PM 10/27/2021    2:55 PM 09/22/2020    9:29 AM 09/02/2020    9:02 AM  Fall Risk   Falls in the past year? 0 0 0 1 0  Number falls in past yr: 0 0 0 0   Injury with Fall? 0 0 0 0   Risk for fall due to : No Fall Risks No Fall Risks No Fall Risks History of fall(s)   Follow up Falls prevention discussed;Falls evaluation completed Falls evaluation completed Falls evaluation completed Falls prevention discussed     FALL RISK PREVENTION PERTAINING TO THE HOME:  Any stairs in or around the home? No  If so, are there any without handrails? No  Home free of loose throw rugs in walkways, pet beds, electrical cords, etc? Yes  Adequate lighting in your home to reduce risk of falls? Yes   ASSISTIVE DEVICES UTILIZED TO PREVENT FALLS:  Life alert? No  Use of a cane, walker or w/c? No  Grab bars in the bathroom? No  Shower chair or bench in shower? No  Elevated toilet seat or a handicapped toilet? No    Cognitive Function:        04/27/2022    9:18 AM  6CIT Screen  What Year? 0 points  What month? 0 points  What time? 0 points  Count back from 20 0  points  Months in reverse 4 points  Repeat phrase 2 points  Total Score 6 points    Immunizations Immunization History  Administered Date(s) Administered   DTaP 03/13/1989, 10/29/1992   HIB (PRP-OMP) 07/18/1988   HPV Quadrivalent 07/20/2005, 10/25/2005, 02/20/2006   Hepatitis A, Adult 02/20/2006   Hepatitis B, ADULT 07/20/2005   IPV 10/29/1992   Influenza, Seasonal, Injecte, Preservative Fre 02/06/2011   MMR 07/18/1988, 10/29/1992   Meningococcal Conjugate 07/20/2005   Tdap 07/20/2005, 05/01/2011   Varicella 02/20/2006    TDAP status: Due, Education has been provided regarding the importance of this vaccine. Advised may receive this vaccine at local pharmacy or Health Dept. Aware to provide a copy of the vaccination record if obtained from local pharmacy or Health Dept. Verbalized acceptance and understanding.  Flu Vaccine status: Declined, Education has been provided regarding the importance of this vaccine but patient still declined. Advised may receive this vaccine at local pharmacy or Health Dept. Aware to provide a copy of the vaccination record if obtained from local pharmacy or Health Dept. Verbalized acceptance and understanding.  Pneumococcal vaccine status: Declined,  Education has been provided regarding the importance of this vaccine but patient still declined. Advised may receive this vaccine at local pharmacy or Health Dept. Aware to provide a copy of the vaccination record if obtained from local pharmacy or Health Dept. Verbalized acceptance and understanding.   Covid-19 vaccine status: Declined, Education has been provided regarding the importance of this vaccine but patient still declined. Advised may receive this vaccine at local pharmacy or Health Dept.or vaccine clinic. Aware to provide a copy of the vaccination record if obtained from local pharmacy or Health Dept. Verbalized acceptance and understanding.  Qualifies for Shingles Vaccine? No   Zostavax completed No    Shingrix Completed?: No.    Education has been provided regarding the importance of this vaccine. Patient has been advised to call insurance company to determine out of pocket expense if they have not yet received this vaccine. Advised may also receive vaccine at local pharmacy or Health Dept. Verbalized acceptance and understanding.  Screening Tests Health Maintenance  Topic Date Due   DTaP/Tdap/Td (5 - Td or Tdap) 04/30/2021   INFLUENZA VACCINE  05/07/2022 (Originally 09/06/2021)   Medicare Annual Wellness (AWV)  04/27/2023   PAP SMEAR-Modifier  09/19/2024   HPV VACCINES  Completed   Hepatitis C Screening  Completed   HIV Screening  Completed   COVID-19 Vaccine  Discontinued    Health Maintenance  Health Maintenance Due  Topic Date Due   DTaP/Tdap/Td (5 - Td or Tdap) 04/30/2021    Lung Cancer Screening: (Low Dose CT Chest recommended if Age 46-80 years, 30 pack-year currently smoking OR have quit w/in 15years.) does not qualify.    Additional Screening:  Hepatitis C Screening: does qualify; Completed 09/19/21  Vision Screening: Recommended annual ophthalmology exams for early detection of glaucoma and other disorders of the eye. Is the patient up to date with their annual eye exam?  No  Who is the provider or what is the name of the office in which the patient attends annual eye exams? No one If pt is not established with a provider, would they like to be referred to a provider to establish care? No .   Dental Screening: Recommended annual dental exams for proper oral hygiene  Community Resource Referral / Chronic Care Management: CRR required this visit?  No   CCM required this visit?  No      Plan:     I have personally reviewed and noted the following in the patient's chart:   Medical and social history Use of alcohol, tobacco or illicit drugs  Current medications and supplements including opioid prescriptions. Patient is not currently taking opioid  prescriptions. Functional ability and status Nutritional status Physical activity Advanced directives List of other physicians Hospitalizations, surgeries, and ER visits in previous 12 months Vitals Screenings to include cognitive, depression, and falls Referrals and appointments  In addition, I have reviewed and discussed with patient certain preventive protocols, quality metrics, and best practice recommendations. A written personalized care plan for preventive services as well as general preventive health recommendations were provided to patient.     Dionisio David, LPN   [id number]   Nurse Notes: none

## 2022-04-27 NOTE — Progress Notes (Signed)
Date:  04/27/2022   Name:  Desiree Gaines   DOB:  01-10-1986   MRN:  [id number]   Chief Complaint: Anemia and Hypothyroidism  Anemia Presents for follow-up visit. There has been no abdominal pain, anorexia, bruising/bleeding easily, confusion, fever, leg swelling, light-headedness, malaise/fatigue, pallor, palpitations, paresthesias, pica or weight loss. Signs of blood loss that are not present include hematemesis, hematochezia, melena, menorrhagia and vaginal bleeding. There are no compliance problems.   Thyroid Problem Presents for follow-up visit. Patient reports no anxiety, cold intolerance, constipation, depressed mood, diaphoresis, diarrhea, dry skin, fatigue, hair loss, heat intolerance, hoarse voice, leg swelling, menstrual problem, nail problem, palpitations, tremors, visual change, weight gain or weight loss. The symptoms have been stable.    Lab Results  Component Value Date   NA 139 05/19/2021   K 4.5 05/19/2021   CO2 20 05/19/2021   GLUCOSE 99 05/19/2021   BUN 11 05/19/2021   CREATININE 1.17 (H) 05/19/2021   CALCIUM 10.5 (H) 05/19/2021   EGFR 63 05/19/2021   GFRNONAA >60 08/07/2020   Lab Results  Component Value Date   CHOL 173 05/19/2021   HDL 49 05/19/2021   LDLCALC 95 05/19/2021   TRIG 171 (H) 05/19/2021   Lab Results  Component Value Date   TSH 4.310 12/26/2021   No results found for: "HGBA1C" Lab Results  Component Value Date   WBC 4.8 05/19/2021   HGB 9.8 (L) 05/19/2021   HCT 28.9 (L) 05/19/2021   MCV 96 05/19/2021   PLT 203 05/19/2021   Lab Results  Component Value Date   ALT 25 08/07/2020   AST 16 08/07/2020   ALKPHOS 112 08/07/2020   BILITOT 0.6 08/07/2020   No results found for: "25OHVITD2", "25OHVITD3", "VD25OH"   Review of Systems  Constitutional:  Negative for diaphoresis, fatigue, fever, malaise/fatigue, weight gain and weight loss.  HENT:  Negative for hoarse voice.   Respiratory:  Negative for shortness of breath and  wheezing.   Cardiovascular:  Negative for chest pain, palpitations and leg swelling.  Gastrointestinal:  Negative for abdominal pain, anorexia, constipation, diarrhea, hematemesis, hematochezia and melena.  Endocrine: Negative for cold intolerance, heat intolerance, polydipsia and polyuria.  Genitourinary:  Negative for difficulty urinating, menorrhagia, menstrual problem and vaginal bleeding.  Musculoskeletal:  Negative for back pain.  Skin:  Negative for pallor.  Neurological:  Negative for tremors, light-headedness and paresthesias.  Hematological:  Does not bruise/bleed easily.  Psychiatric/Behavioral:  Negative for confusion. The patient is not nervous/anxious.     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   Ventral hernia without obstruction or gangrene 08/12/2020   Facial cellulitis 08/02/2020   Hypothyroidism 08/02/2020   Acute posthemorrhagic anemia 07/09/2011   Delivery with history of cesarean section 07/08/2011   Other postprocedural status(V45.89) 07/08/2011   Iron deficiency anemia 03/06/2011   Maternal thyroid dysfunction, antepartum 01/21/2007   Developmental delay 06/19/2000    No Known Allergies  Past Surgical History:  Procedure Laterality Date   CESAREAN SECTION  06/2011   x 2   COLONOSCOPY WITH PROPOFOL N/A 07/18/2021   Procedure: COLONOSCOPY WITH PROPOFOL;  Surgeon: Jonathon Bellows, MD;  [facility] ENDOSCOPY;  Service: Gastroenterology;  Laterality: N/A;   tubes in ears Bilateral     Social History   Tobacco Use  Smoking status: Never    Passive exposure: Never   Smokeless tobacco: Never  Vaping Use   Vaping Use: Never used  Substance Use Topics   Alcohol use: Not Currently   Drug use: Never     Medication list has been reviewed and  updated.  Current Meds  Medication Sig   ferrous sulfate 325 (65 FE) MG tablet TAKE 1 TABLET (325 MG)BY MOUTH DAILY   levothyroxine (SYNTHROID) 125 MCG tablet Take 1 tablet (125 mcg total) by mouth daily.   medroxyPROGESTERone (DEPO-PROVERA) 150 MG/ML injection Inject 150 mg into the muscle every 3 (three) months.       04/27/2022    2:11 PM 12/26/2021    3:48 PM 10/27/2021    2:57 PM 05/19/2021    9:39 AM  GAD 7 : Generalized Anxiety Score  Nervous, Anxious, on Edge 0 0 0 1  Control/stop worrying 0 0 0 1  Worry too much - different things 0 0 0 1  Trouble relaxing 0 0 0 0  Restless 0 0 0 0  Easily annoyed or irritable 0 0 0 0  Afraid - awful might happen 0 0 0 0  Total GAD 7 Score 0 0 0 3  Anxiety Difficulty Not difficult at all Not difficult at all Not difficult at all Not difficult at all       04/27/2022    2:10 PM 04/27/2022    9:13 AM 12/26/2021    3:47 PM  Depression screen PHQ 2/9  Decreased Interest 0 0 0  Down, Depressed, Hopeless 0 0 0  PHQ - 2 Score 0 0 0  Altered sleeping 0 0 0  Tired, decreased energy 0 0 0  Change in appetite 0 0 0  Feeling bad or failure about yourself  0 0 0  Trouble concentrating 0 0 0  Moving slowly or fidgety/restless 0 0 0  Suicidal thoughts 0 0 0  PHQ-9 Score 0 0 0  Difficult doing work/chores Not difficult at all Not difficult at all Not difficult at all    BP Readings from Last 3 Encounters:  04/27/22 120/78  03/08/22 128/84  12/26/21 120/76    Physical Exam Vitals and nursing note reviewed. Exam conducted with a chaperone present.  Constitutional:      General: She is not in acute distress.    Appearance: She is not diaphoretic.  HENT:     Head: Normocephalic and atraumatic.     Right Ear: Tympanic membrane, ear canal and external ear normal.     Left Ear: Tympanic membrane, ear canal and external ear normal.     Nose: Nose normal.     Mouth/Throat:     Mouth: Mucous membranes are moist.     Pharynx: No  oropharyngeal exudate or posterior oropharyngeal erythema.  Eyes:     General:        Right eye: No discharge.        Left eye: No discharge.     Conjunctiva/sclera: Conjunctivae normal.     Pupils: Pupils are equal, round, and reactive to light.  Neck:     Thyroid: No thyromegaly.     Vascular: No JVD.  Cardiovascular:     Rate and Rhythm: Normal rate and regular rhythm.     Heart sounds: Normal heart sounds. No murmur heard.    No friction rub. No gallop.  Pulmonary:     Effort: Pulmonary effort is normal.     Breath sounds: Normal breath sounds. No  wheezing, rhonchi or rales.  Abdominal:     General: Bowel sounds are normal.     Palpations: Abdomen is soft. There is no mass.     Tenderness: There is no abdominal tenderness. There is no guarding.  Musculoskeletal:        General: Normal range of motion.     Cervical back: Normal range of motion and neck supple.  Lymphadenopathy:     Cervical: No cervical adenopathy.  Skin:    General: Skin is warm and dry.  Neurological:     Mental Status: She is alert.     Deep Tendon Reflexes: Reflexes are normal and symmetric.     Wt Readings from Last 3 Encounters:  04/27/22 193 lb (87.5 kg)  04/27/22 193 lb (87.5 kg)  03/08/22 194 lb (88 kg)    BP 120/78   Pulse 100   Ht 5\' 4"  (1.626 m)   Wt 193 lb (87.5 kg)   LMP 02/07/2022 (Approximate) Comment: on depo  SpO2 99%   BMI 33.13 kg/m   Assessment and Plan:     Otilio Miu, MD

## 2022-04-27 NOTE — Patient Instructions (Signed)
Desiree Gaines , Thank you for taking time to come for your Medicare Wellness Visit. I appreciate your ongoing commitment to your health goals. Please review the following plan we discussed and let me know if I can assist you in the future.   These are the goals we discussed:  Goals      DIET - EAT MORE FRUITS AND VEGETABLES     Increase physical activity     Recommend increasing physical activity to 150 minutes per week      Obtain safe and stable housing     Timeframe:  Long-Range Goal Priority:  High Start Date:   10/08/20                          Expected End Date:   10/31                    Follow Up Date 11/23/20   Patient Goals/Self-Care Activities: Over the next 120 days Follow up with [facility] Peer Worker Follow up with SW CM at [facility] regarding housing resources Continue participation in counseling  Attend all scheduled appointments Contact office with any questions or concerns         This is a list of the screening recommended for you and due dates:  Health Maintenance  Topic Date Due   DTaP/Tdap/Td vaccine (5 - Td or Tdap) 04/30/2021   Flu Shot  05/07/2022*   Medicare Annual Wellness Visit  04/27/2023   Pap Smear  09/19/2024   HPV Vaccine  Completed   Hepatitis C Screening: USPSTF Recommendation to screen - Ages 39-79 yo.  Completed   HIV Screening  Completed   COVID-19 Vaccine  Discontinued  *Topic was postponed. The date shown is not the original due date.    Advanced directives: no  Conditions/risks identified: none  Next appointment: Follow up in one year for your annual wellness visit. 05/02/23 @ 3:30 pm by phone  Preventive Care 33-35 Years Old, Female Preventive care refers to lifestyle choices and visits with your health care provider that can promote health and wellness. Preventive care visits are also called wellness exams. What can I expect for my preventive care visit? Counseling During your preventive care visit, your health care provider may ask  about your: Medical history, including: Past medical problems. Family medical history. Pregnancy history. Current health, including: Menstrual cycle. Method of birth control. Emotional well-being. Home life and relationship well-being. Sexual activity and sexual health. Lifestyle, including: Alcohol, nicotine or tobacco, and drug use. Access to firearms. Diet, exercise, and sleep habits. Work and work Statistician. Sunscreen use. Safety issues such as seatbelt and bike helmet use. Physical exam Your health care provider may check your: Height and weight. These may be used to calculate your BMI (body mass index). BMI is a measurement that tells if you are at a healthy weight. Waist circumference. This measures the distance around your waistline. This measurement also tells if you are at a healthy weight and may help predict your risk of certain diseases, such as type 2 diabetes and high blood pressure. Heart rate and blood pressure. Body temperature. Skin for abnormal spots. What immunizations do I need? Vaccines are usually given at various ages, according to a schedule. Your health care provider will recommend vaccines for you based on your age, medical history, and lifestyle or other factors, such as travel or where you work. What tests do I need? Screening Your health care provider  may recommend screening tests for certain conditions. This may include: Pelvic exam and Pap test. Lipid and cholesterol levels. Diabetes screening. This is done by checking your blood sugar (glucose) after you have not eaten for a while (fasting). Hepatitis B test. Hepatitis C test. HIV (human immunodeficiency virus) test. STI (sexually transmitted infection) testing, if you are at risk. BRCA-related cancer screening. This may be done if you have a family history of breast, ovarian, tubal, or peritoneal cancers. Talk with your health care provider about your test results, treatment options, and if  necessary, the need for more tests. Follow these instructions at home: Eating and drinking  Eat a healthy diet that includes fresh fruits and vegetables, whole grains, lean protein, and low-fat dairy products. Take vitamin and mineral supplements as recommended by your health care provider. Do not drink alcohol if: Your health care provider tells you not to drink. You are pregnant, may be pregnant, or are planning to become pregnant. If you drink alcohol: Limit how much you have to 0-1 drink a day. Know how much alcohol is in your drink. In the [place] one drink equals one 12 oz bottle of beer (355 mL), one 5 oz glass of wine (148 mL), or one 1 oz glass of hard liquor (44 mL). Lifestyle Brush your teeth every morning and night with fluoride toothpaste. Floss one time each day. Exercise for at least 30 minutes 5 or more days each week. Do not use any products that contain nicotine or tobacco. These products include cigarettes, chewing tobacco, and vaping devices, such as e-cigarettes. If you need help quitting, ask your health care provider. Do not use drugs. If you are sexually active, practice safe sex. Use a condom or other form of protection to prevent STIs. If you do not wish to become pregnant, use a form of birth control. If you plan to become pregnant, see your health care provider for a prepregnancy visit. Find healthy ways to manage stress, such as: Meditation, yoga, or listening to music. Journaling. Talking to a trusted person. Spending time with friends and family. Minimize exposure to UV radiation to reduce your risk of skin cancer. Safety Always wear your seat belt while driving or riding in a vehicle. Do not drive: If you have been drinking alcohol. Do not ride with someone who has been drinking. If you have been using any mind-altering substances or drugs. While texting. When you are tired or distracted. Wear a helmet and other protective equipment during sports  activities. If you have firearms in your house, make sure you follow all gun safety procedures. Seek help if you have been physically or sexually abused. What's next? Go to your health care provider once a year for an annual wellness visit. Ask your health care provider how often you should have your eyes and teeth checked. Stay up to date on all vaccines. This information is not intended to replace advice given to you by your health care provider. Make sure you discuss any questions you have with your health care provider. Document Revised: 07/21/2020 Document Reviewed: 07/21/2020 Elsevier Patient Education  [place].

## 2022-05-03 ENCOUNTER — Telehealth: Payer: Self-pay

## 2022-05-03 NOTE — Telephone Encounter (Signed)
Called Elmyra Ricks and told her we had to get pt's labs drawn- she said pt will go next week

## 2022-05-10 DIAGNOSIS — E039 Hypothyroidism, unspecified: Secondary | ICD-10-CM | POA: Diagnosis not present

## 2022-05-10 DIAGNOSIS — Z862 Personal history of diseases of the blood and blood-forming organs and certain disorders involving the immune mechanism: Secondary | ICD-10-CM | POA: Diagnosis not present

## 2022-05-11 ENCOUNTER — Other Ambulatory Visit: Payer: Self-pay

## 2022-05-11 DIAGNOSIS — D508 Other iron deficiency anemias: Secondary | ICD-10-CM

## 2022-05-11 LAB — CBC WITH DIFFERENTIAL/PLATELET
Basophils Absolute: 0.1 10*3/uL (ref 0.0–0.2)
Basos: 1 %
EOS (ABSOLUTE): 0.2 10*3/uL (ref 0.0–0.4)
Eos: 2 %
Hematocrit: 29.6 % — ABNORMAL LOW (ref 34.0–46.6)
Hemoglobin: 8.6 g/dL — ABNORMAL LOW (ref 11.1–15.9)
Immature Grans (Abs): 0.1 10*3/uL (ref 0.0–0.1)
Immature Granulocytes: 1 %
Lymphocytes Absolute: 2.4 10*3/uL (ref 0.7–3.1)
Lymphs: 31 %
MCH: 23.3 pg — ABNORMAL LOW (ref 26.6–33.0)
MCHC: 29.1 g/dL — ABNORMAL LOW (ref 31.5–35.7)
MCV: 80 fL (ref 79–97)
Monocytes Absolute: 0.6 10*3/uL (ref 0.1–0.9)
Monocytes: 7 %
Neutrophils Absolute: 4.5 10*3/uL (ref 1.4–7.0)
Neutrophils: 58 %
Platelets: 364 10*3/uL (ref 150–450)
RBC: 3.69 x10E6/uL — ABNORMAL LOW (ref 3.77–5.28)
RDW: 17.9 % — ABNORMAL HIGH (ref 11.7–15.4)
WBC: 7.7 10*3/uL (ref 3.4–10.8)

## 2022-05-11 LAB — THYROID PANEL WITH TSH
Free Thyroxine Index: 1.5 (ref 1.2–4.9)
T3 Uptake Ratio: 21 % — ABNORMAL LOW (ref 24–39)
T4, Total: 7.3 ug/dL (ref 4.5–12.0)
TSH: 29.9 u[IU]/mL — ABNORMAL HIGH (ref 0.450–4.500)

## 2022-05-11 LAB — FERRITIN: Ferritin: 12 ng/mL — ABNORMAL LOW (ref 15–150)

## 2022-05-13 ENCOUNTER — Other Ambulatory Visit: Payer: Self-pay | Admitting: Obstetrics

## 2022-05-15 ENCOUNTER — Inpatient Hospital Stay: Payer: 59 | Attending: Internal Medicine | Admitting: Internal Medicine

## 2022-05-15 ENCOUNTER — Inpatient Hospital Stay: Payer: 59

## 2022-05-15 ENCOUNTER — Encounter: Payer: Self-pay | Admitting: Internal Medicine

## 2022-05-15 VITALS — BP 124/85 | HR 110 | Temp 98.7°F | Resp 16 | Ht 62.0 in | Wt 189.1 lb

## 2022-05-15 DIAGNOSIS — E0500 Thyrotoxicosis with diffuse goiter without thyrotoxic crisis or storm: Secondary | ICD-10-CM | POA: Diagnosis not present

## 2022-05-15 DIAGNOSIS — Z8042 Family history of malignant neoplasm of prostate: Secondary | ICD-10-CM | POA: Diagnosis not present

## 2022-05-15 DIAGNOSIS — D509 Iron deficiency anemia, unspecified: Secondary | ICD-10-CM | POA: Insufficient documentation

## 2022-05-15 DIAGNOSIS — Z803 Family history of malignant neoplasm of breast: Secondary | ICD-10-CM | POA: Diagnosis not present

## 2022-05-15 DIAGNOSIS — D649 Anemia, unspecified: Secondary | ICD-10-CM

## 2022-05-15 DIAGNOSIS — D508 Other iron deficiency anemias: Secondary | ICD-10-CM

## 2022-05-15 NOTE — Progress Notes (Signed)
[facility]  [phone] [phone]) [phone]  ID: Desiree Gaines OB: May 12, 1985  MR#: [id number]  BSJ#:[id number]  Patient Care Team: Duanne Limerick, MD as PCP - General (Family Medicine) Bridgett Larsson, LCSW as Social Worker (Licensed Clinical Social Worker)  REFERRING PROVIDER: Dr. Yetta Barre  REASON FOR REFERRAL: Iron deficiency anemia  HPI: Desiree Gaines is a 37 y.o. female with past medical history of Graves' disease was referred to hematology for management of iron deficiency anemia  Patient has long standing history of anemia.  Most recent blood work from 05/10/2022 showed hemoglobin 8.6, MCV 80, platelets 364 and WBC 7.7.  Ferritin 12.  She could not tolerate oral iron due to GI upset.  Has history of heavy menstrual period which has significantly improved after she was started on Depo sometime in August 2023.  She had colonoscopy on 07/18/2021 when Dr. Tobi Bastos follow-up for volvulus.  Showed 5 mm polyp in ascending colon otherwise unremarkable.   REVIEW OF SYSTEMS:   ROS  As per HPI. Otherwise, a complete review of systems is negative.  PAST MEDICAL HISTORY: Past Medical History:  Diagnosis Date   Adrenal nodule    Anemia    Cancer    Graves disease    Hypothyroidism    Pituitary tumor    Thyroid cancer    Thyroid disease    Umbilical hernia     PAST SURGICAL HISTORY: Past Surgical History:  Procedure Laterality Date   CESAREAN SECTION  06/2011   x 2   COLONOSCOPY WITH PROPOFOL N/A 07/18/2021   Procedure: COLONOSCOPY WITH PROPOFOL;  Surgeon: Wyline Mood, MD;  Location: [facility] ENDOSCOPY;  Service: Gastroenterology;  Laterality: N/A;   tubes in ears Bilateral     FAMILY HISTORY: Family History  Problem Relation Age of Onset   Diabetes Mother    Hypertension Mother    Stroke Mother    Prostate cancer Father    Hypertension Sister    Breast cancer Sister    Heart disease Brother     HEALTH MAINTENANCE: Social History    Tobacco Use   Smoking status: Never    Passive exposure: Never   Smokeless tobacco: Never  Vaping Use   Vaping Use: Never used  Substance Use Topics   Alcohol use: Not Currently   Drug use: Never     No Known Allergies  Current Outpatient Medications  Medication Sig Dispense Refill   levothyroxine (SYNTHROID) 125 MCG tablet Take 1 tablet (125 mcg total) by mouth daily. 90 tablet 1   medroxyPROGESTERone (DEPO-PROVERA) 150 MG/ML injection Inject 150 mg into the muscle every 3 (three) months.     cyanocobalamin (VITAMIN B12) 1000 MCG tablet Take 1,000 mcg by mouth daily. Gummy otc (Patient not taking: Reported on 04/27/2022)     ferrous sulfate 325 (65 FE) MG tablet TAKE 1 TABLET (325 MG)BY MOUTH DAILY (Patient not taking: Reported on 05/15/2022) 90 tablet 0   folic acid (FOLVITE) 1 MG tablet Take 1 tablet (1 mg total) by mouth daily. (Patient not taking: Reported on 12/26/2021) 90 tablet 0   No current facility-administered medications for this visit.    OBJECTIVE: Vitals:   05/15/22 1415  BP: 124/85  Pulse: (!) 110  Resp: 16  Temp: 98.7 F (37.1 C)  SpO2: 100%     Body mass index is 34.59 kg/m.      General: Well-developed, well-nourished, no acute distress. Eyes: Pink conjunctiva, anicteric sclera. HEENT: Normocephalic, moist mucous membranes, clear oropharnyx. Lungs:  Clear to auscultation bilaterally. Heart: Regular rate and rhythm. No rubs, murmurs, or gallops. Abdomen: Soft, nontender, nondistended. No organomegaly noted, normoactive bowel sounds. Musculoskeletal: No edema, cyanosis, or clubbing. Neuro: Alert, answering all questions appropriately. Cranial nerves grossly intact. Skin: No rashes or petechiae noted. Psych: Normal affect. Lymphatics: No cervical, calvicular, axillary or inguinal LAD.   LAB RESULTS:  Lab Results  Component Value Date   NA 139 05/19/2021   K 4.5 05/19/2021   CL 105 05/19/2021   CO2 20 05/19/2021   GLUCOSE 99 05/19/2021   BUN  11 05/19/2021   CREATININE 1.17 (H) 05/19/2021   CALCIUM 10.5 (H) 05/19/2021   PROT 6.9 08/07/2020   ALBUMIN 4.9 (H) 05/19/2021   AST 16 08/07/2020   ALT 25 08/07/2020   ALKPHOS 112 08/07/2020   BILITOT 0.6 08/07/2020   GFRNONAA >60 08/07/2020   GFRAA >60 07/17/2011    Lab Results  Component Value Date   WBC 7.7 05/10/2022   NEUTROABS 4.5 05/10/2022   HGB 8.6 (L) 05/10/2022   HCT 29.6 (L) 05/10/2022   MCV 80 05/10/2022   PLT 364 05/10/2022    Lab Results  Component Value Date   TIBC 363 05/10/2021   TIBC 297 08/04/2020   FERRITIN 12 (L) 05/10/2022   FERRITIN 216 (H) 05/19/2021   FERRITIN 26 05/10/2021   IRONPCTSAT 18 05/10/2021   IRONPCTSAT 28 08/04/2020     STUDIES: No results found.  ASSESSMENT AND PLAN:   Desiree Gaines is a 37 y.o. female with pmh of Graves' disease was referred to hematology for management of iron deficiency anemia.  # Iron deficiency anemia -Progressive.  Hemoglobin of 8.6.  Ferritin 12.  Could be from prior heavy menstrual period versus occult GI bleed could not be excluded.  Her menstrual period has improved since she was started on Depo progesterone in August 2023.  She had colonoscopy done in June 2023 by Dr. Tobi Bastos for volvulus which showed 5 mm polyp in the ascending colon otherwise was unremarkable.  I will reach out to Dr. Tobi Bastos for consideration of endoscopy.  -She could not tolerate oral iron due to GI upset.  I discussed about IV Venofer 200 mg weekly x 5 doses.  Occasional side effects of nausea and back pain was discussed.  Rarely it can cause allergic reaction.  Orders Placed This Encounter  Procedures   CBC with Differential/Platelet   Iron and TIBC   Ferritin   B12 and Folate Panel     Schedule for IV Venofer weekly x 5 doses  RTC in 4 months for MD visit, labs  Patient expressed understanding and was in agreement with this plan. She also understands that She can call clinic at any time with any questions, concerns, or  complaints.   I spent a total of 45 minutes reviewing chart data, face-to-face evaluation with the patient, counseling and coordination of care as detailed above.  Michaelyn Barter, MD   05/15/2022 4:06 PM

## 2022-05-17 ENCOUNTER — Inpatient Hospital Stay: Payer: 59

## 2022-05-17 VITALS — BP 127/77 | HR 82 | Temp 98.0°F | Resp 18

## 2022-05-17 DIAGNOSIS — D508 Other iron deficiency anemias: Secondary | ICD-10-CM

## 2022-05-17 DIAGNOSIS — E0500 Thyrotoxicosis with diffuse goiter without thyrotoxic crisis or storm: Secondary | ICD-10-CM | POA: Diagnosis not present

## 2022-05-17 DIAGNOSIS — D509 Iron deficiency anemia, unspecified: Secondary | ICD-10-CM | POA: Diagnosis not present

## 2022-05-17 DIAGNOSIS — Z803 Family history of malignant neoplasm of breast: Secondary | ICD-10-CM | POA: Diagnosis not present

## 2022-05-17 MED ORDER — SODIUM CHLORIDE 0.9 % IV SOLN
200.0000 mg | Freq: Once | INTRAVENOUS | Status: AC
  Administered 2022-05-17: 200 mg via INTRAVENOUS
  Filled 2022-05-17: qty 200

## 2022-05-17 MED ORDER — SODIUM CHLORIDE 0.9 % IV SOLN
Freq: Once | INTRAVENOUS | Status: AC
  Filled 2022-05-17: qty 250

## 2022-05-17 NOTE — Patient Instructions (Signed)

## 2022-05-18 ENCOUNTER — Other Ambulatory Visit: Payer: Self-pay

## 2022-05-18 ENCOUNTER — Telehealth: Payer: Self-pay

## 2022-05-18 ENCOUNTER — Encounter: Payer: Self-pay | Admitting: Internal Medicine

## 2022-05-18 DIAGNOSIS — D649 Anemia, unspecified: Secondary | ICD-10-CM

## 2022-05-18 NOTE — Telephone Encounter (Signed)
Called patient to let her know that she is needing an EGD due to her anemia and she stated that she would move forward. I was able to schedule her for 06/01/2022.

## 2022-05-18 NOTE — Telephone Encounter (Signed)
-----   Message from Wyline Mood, MD sent at 05/16/2022 12:50 PM EDT ----- Hi She has not had EGD , didn't find any record   Kizzy Olafson can we schedule for EGD please for iron def anemia   Kiran ----- Message ----- From: Michaelyn Barter, MD Sent: 05/16/2022  12:38 PM EDT To: Wyline Mood, MD  Hello Dr. Tobi Bastos,   I saw her as a new patient for IDA. She had colonoscopy in June 2023 for volvulus just showed 5 mm polyp in ascending colon. She tells me she is pretty sure she had upper endoscopy too. I am unable to find any records. If she has not had one, could you please assess for endoscopy.   Thank you  Candise Che

## 2022-05-24 ENCOUNTER — Inpatient Hospital Stay: Payer: 59

## 2022-05-24 VITALS — BP 156/95 | HR 112 | Temp 99.2°F | Resp 16

## 2022-05-24 DIAGNOSIS — D508 Other iron deficiency anemias: Secondary | ICD-10-CM

## 2022-05-24 DIAGNOSIS — Z803 Family history of malignant neoplasm of breast: Secondary | ICD-10-CM | POA: Diagnosis not present

## 2022-05-24 DIAGNOSIS — E0500 Thyrotoxicosis with diffuse goiter without thyrotoxic crisis or storm: Secondary | ICD-10-CM | POA: Diagnosis not present

## 2022-05-24 DIAGNOSIS — D509 Iron deficiency anemia, unspecified: Secondary | ICD-10-CM | POA: Diagnosis not present

## 2022-05-24 MED ORDER — SODIUM CHLORIDE 0.9 % IV SOLN
Freq: Once | INTRAVENOUS | Status: AC
  Filled 2022-05-24: qty 250

## 2022-05-24 MED ORDER — SODIUM CHLORIDE 0.9 % IV SOLN
200.0000 mg | Freq: Once | INTRAVENOUS | Status: AC
  Administered 2022-05-24: 200 mg via INTRAVENOUS
  Filled 2022-05-24: qty 200

## 2022-05-24 NOTE — Patient Instructions (Signed)
[facility]  Discharge Instructions: Thank you for choosing [facility] to provide your oncology and hematology care.  If you have a lab appointment with the Cancer Center, please go directly to the Cancer Center and check in at the registration area.  Wear comfortable clothing and clothing appropriate for easy access to any Portacath or PICC line.   We strive to give you quality time with your provider. You may need to reschedule your appointment if you arrive late (15 or more minutes).  Arriving late affects you and other patients whose appointments are after yours.  Also, if you miss three or more appointments without notifying the office, you may be dismissed from the clinic at the provider's discretion.      For prescription refill requests, have your pharmacy contact our office and allow 72 hours for refills to be completed.    Today you received the following chemotherapy and/or immunotherapy agents Venofer.      To help prevent nausea and vomiting after your treatment, we encourage you to take your nausea medication as directed.  BELOW ARE SYMPTOMS THAT SHOULD BE REPORTED IMMEDIATELY: *FEVER GREATER THAN 100.4 F (38 C) OR HIGHER *CHILLS OR SWEATING *NAUSEA AND VOMITING THAT IS NOT CONTROLLED WITH YOUR NAUSEA MEDICATION *UNUSUAL SHORTNESS OF BREATH *UNUSUAL BRUISING OR BLEEDING *URINARY PROBLEMS (pain or burning when urinating, or frequent urination) *BOWEL PROBLEMS (unusual diarrhea, constipation, pain near the anus) TENDERNESS IN MOUTH AND THROAT WITH OR WITHOUT PRESENCE OF ULCERS (sore throat, sores in mouth, or a toothache) UNUSUAL RASH, SWELLING OR PAIN  UNUSUAL VAGINAL DISCHARGE OR ITCHING   Items with * indicate a potential emergency and should be followed up as soon as possible or go to the Emergency Department if any problems should occur.  Please show the CHEMOTHERAPY ALERT CARD or IMMUNOTHERAPY ALERT CARD at check-in to  the Emergency Department and triage nurse.  Should you have questions after your visit or need to cancel or reschedule your appointment, please contact [facility]  [phone] and follow the prompts.  Office hours are 8:00 a.m. to 4:30 p.m. Monday - Friday. Please note that voicemails left after 4:00 p.m. may not be returned until the following business day.  We are closed weekends and major holidays. You have access to a nurse at all times for urgent questions. Please call the main number to the clinic [phone] and follow the prompts.  For any non-urgent questions, you may also contact your provider using MyChart. We now offer e-Visits for anyone 18 and older to request care online for non-urgent symptoms. For details visit [email or web address].   Also download the MyChart app! Go to the app store, search "MyChart", open the app, select [facility], and log in with your MyChart username and password.   

## 2022-05-25 ENCOUNTER — Encounter: Payer: Self-pay | Admitting: Gastroenterology

## 2022-05-29 ENCOUNTER — Ambulatory Visit (INDEPENDENT_AMBULATORY_CARE_PROVIDER_SITE_OTHER): Payer: 59

## 2022-05-29 VITALS — BP 127/84 | HR 111 | Ht 62.0 in | Wt 186.0 lb

## 2022-05-29 DIAGNOSIS — Z3042 Encounter for surveillance of injectable contraceptive: Secondary | ICD-10-CM

## 2022-05-29 MED ORDER — MEDROXYPROGESTERONE ACETATE 150 MG/ML IM SUSY
150.0000 mg | PREFILLED_SYRINGE | Freq: Once | INTRAMUSCULAR | Status: AC
Start: 2022-05-29 — End: 2022-05-29
  Administered 2022-05-29: 150 mg via INTRAMUSCULAR

## 2022-05-29 NOTE — Patient Instructions (Signed)

## 2022-05-29 NOTE — Progress Notes (Signed)
    NURSE VISIT NOTE  Subjective:    Patient ID: Desiree Gaines, female    DOB: [birth year], 37 y.o.   MRN: [id number]  HPI  Patient is a 37 y.o. G9P1002 female who presents for depo provera injection.   Objective:    BP 127/84   Pulse (!) 111   Ht  (1.575 m)   Wt 186 lb (84.4 kg)   LMP 02/07/2022 (Approximate) Comment: on depo; reports no periods  BMI 34.02 kg/m   Last Annual: 09/19/2021. Last pap: 09/19/2021. Last Depo-Provera: 03/08/2022. Side Effects if any: none. Serum HCG indicated? No . Depo-Provera 150 mg IM given by: Donnetta Hail, CMA. Site: Left Upper Outer Quandrant   Assessment:   1. Encounter for surveillance of injectable contraceptive      Plan:   Next appointment due between July 8 and July 22.    Donnetta Hail, CMA

## 2022-05-30 ENCOUNTER — Ambulatory Visit: Payer: 59

## 2022-05-31 ENCOUNTER — Inpatient Hospital Stay: Payer: 59

## 2022-05-31 VITALS — BP 108/87 | HR 90 | Temp 97.2°F | Resp 16

## 2022-05-31 DIAGNOSIS — D509 Iron deficiency anemia, unspecified: Secondary | ICD-10-CM | POA: Diagnosis not present

## 2022-05-31 DIAGNOSIS — Z803 Family history of malignant neoplasm of breast: Secondary | ICD-10-CM | POA: Diagnosis not present

## 2022-05-31 DIAGNOSIS — D508 Other iron deficiency anemias: Secondary | ICD-10-CM

## 2022-05-31 DIAGNOSIS — E0500 Thyrotoxicosis with diffuse goiter without thyrotoxic crisis or storm: Secondary | ICD-10-CM | POA: Diagnosis not present

## 2022-05-31 MED ORDER — SODIUM CHLORIDE 0.9 % IV SOLN
Freq: Once | INTRAVENOUS | Status: AC
  Filled 2022-05-31: qty 250

## 2022-05-31 MED ORDER — SODIUM CHLORIDE 0.9 % IV SOLN
200.0000 mg | Freq: Once | INTRAVENOUS | Status: AC
  Administered 2022-05-31: 200 mg via INTRAVENOUS
  Filled 2022-05-31: qty 200

## 2022-05-31 NOTE — Patient Instructions (Signed)

## 2022-06-01 ENCOUNTER — Ambulatory Visit: Admission: RE | Admit: 2022-06-01 | Payer: 59 | Source: Ambulatory Visit | Admitting: Gastroenterology

## 2022-06-01 ENCOUNTER — Encounter: Admission: RE | Payer: Self-pay | Source: Ambulatory Visit

## 2022-06-01 SURGERY — ESOPHAGOGASTRODUODENOSCOPY (EGD) WITH PROPOFOL
Anesthesia: General

## 2022-06-07 ENCOUNTER — Inpatient Hospital Stay: Payer: 59 | Attending: Internal Medicine

## 2022-06-07 DIAGNOSIS — D509 Iron deficiency anemia, unspecified: Secondary | ICD-10-CM | POA: Diagnosis not present

## 2022-06-07 DIAGNOSIS — D508 Other iron deficiency anemias: Secondary | ICD-10-CM

## 2022-06-07 MED ORDER — SODIUM CHLORIDE 0.9 % IV SOLN
200.0000 mg | Freq: Once | INTRAVENOUS | Status: AC
  Administered 2022-06-07: 200 mg via INTRAVENOUS
  Filled 2022-06-07: qty 200

## 2022-06-07 MED ORDER — SODIUM CHLORIDE 0.9 % IV SOLN
Freq: Once | INTRAVENOUS | Status: AC
  Filled 2022-06-07: qty 250

## 2022-06-07 NOTE — Patient Instructions (Signed)

## 2022-06-14 ENCOUNTER — Inpatient Hospital Stay: Payer: 59

## 2022-06-14 VITALS — BP 113/85 | HR 92 | Temp 97.9°F | Resp 19

## 2022-06-14 DIAGNOSIS — D509 Iron deficiency anemia, unspecified: Secondary | ICD-10-CM | POA: Diagnosis not present

## 2022-06-14 DIAGNOSIS — D508 Other iron deficiency anemias: Secondary | ICD-10-CM

## 2022-06-14 MED ORDER — SODIUM CHLORIDE 0.9 % IV SOLN
Freq: Once | INTRAVENOUS | Status: AC
  Filled 2022-06-14: qty 250

## 2022-06-14 MED ORDER — SODIUM CHLORIDE 0.9 % IV SOLN
200.0000 mg | Freq: Once | INTRAVENOUS | Status: AC
  Administered 2022-06-14: 200 mg via INTRAVENOUS
  Filled 2022-06-14: qty 200

## 2022-06-14 NOTE — Patient Instructions (Signed)

## 2022-07-03 ENCOUNTER — Other Ambulatory Visit: Payer: Self-pay

## 2022-08-21 ENCOUNTER — Telehealth: Payer: Self-pay

## 2022-08-21 ENCOUNTER — Ambulatory Visit (INDEPENDENT_AMBULATORY_CARE_PROVIDER_SITE_OTHER): Payer: 59

## 2022-08-21 VITALS — BP 122/79 | HR 93 | Ht 62.0 in | Wt 200.9 lb

## 2022-08-21 DIAGNOSIS — Z3042 Encounter for surveillance of injectable contraceptive: Secondary | ICD-10-CM

## 2022-08-21 MED ORDER — MEDROXYPROGESTERONE ACETATE 150 MG/ML IM SUSP
150.0000 mg | Freq: Once | INTRAMUSCULAR | Status: AC
Start: 2022-08-21 — End: 2022-08-21
  Administered 2022-08-21: 150 mg via INTRAMUSCULAR

## 2022-08-21 NOTE — Telephone Encounter (Signed)
Pt left msg on triage requesting a call back. I called her back and she wanted to cancel her upcoming annual exam. I advised her to keep it (and she agreed) so she can discuss her birth control and any other concerns.

## 2022-08-21 NOTE — Progress Notes (Addendum)
    NURSE VISIT NOTE  Subjective:    Patient ID: Desiree Gaines, female    DOB: 11-Aug-1985, 37 y.o.   MRN: [id number]  HPI  Patient is a 37 y.o. G10P1002 female who presents for depo provera injection.   Objective:    BP 122/79   Pulse 93   Ht 5\' 2"  (1.575 m)   Wt 200 lb 14.4 oz (91.1 kg)   BMI 36.75 kg/m   Last Annual: 09/19/21. Last pap: 09/19/21. Last Depo-Provera: 05/29/22. Side Effects if any: none Serum HCG indicated? No . Depo-Provera 150 mg IM given by: Cornelius Moras, CMA. Site: Right Ventrogluteal  Lab Review  @THIS  VISIT ONLY@  Assessment:   1. Encounter for surveillance of injectable contraceptive      Plan:   Next appointment due between 11/06/22 and 11/20/2022.    Cornelius Moras, CMA

## 2022-08-21 NOTE — Patient Instructions (Signed)
Contraceptive Injection A contraceptive injection is a shot that prevents pregnancy. It is also called a birth control shot. The shot contains the hormone progestin, which prevents pregnancy by: Stopping the ovaries from releasing eggs. Thickening cervical mucus to prevent sperm from entering the cervix. Thinning the lining of the uterus to prevent a fertilized egg from attaching to the uterus. Contraceptive injections are given under the skin (subcutaneous) or into a muscle (intramuscular). For these shots to work, you must get one of them every 3 months (12-13 weeks) from a health care provider. Tell a health care provider about: Any allergies you have. All medicines you are taking, including vitamins, herbs, eye drops, creams, and over-the-counter medicines. Any blood disorders you have. Any medical conditions you have. Whether you are pregnant or may be pregnant. What are the risks? Generally, this is a safe procedure. However, problems may occur, including: Mood changes or depression. Loss of bone density (osteoporosis) after long-term use. Blood clots. This is rare. Higher risk of an egg being fertilized outside your uterus (ectopic pregnancy).This is rare. What happens before the procedure? Your health care provider may do a routine physical exam. You may have a test to make sure you are not pregnant. What happens during the procedure?  The area where the shot will be given will be cleaned and sanitized with alcohol. A needle will be inserted into a muscle in your upper arm or buttock, or into the skin of your thigh or abdomen. The needle will be attached to a syringe with the medicine inside of it. The medicine will be pushed through the syringe and injected into your body. A small bandage (dressing) may be placed over the injection site. What can I expect after the procedure? After the procedure, it is common to have: Soreness around the injection site for a couple of  days. Irregular menstrual bleeding. Weight gain. Breast tenderness. Headaches. Discomfort in your abdomen. Ask your health care provider whether you need to use an added method of birth control (backup contraception), such as a condom, sponge, or spermicide. If the first shot is given 1-7 days after the start of your last menstrual period, you will not need backup contraception. If the first shot is given at any other time during your menstrual cycle, you should avoid having sex. If you do have sex, you will need to use backup contraception for 7 days after you receive the shot. Follow these instructions at home: General instructions Take over-the-counter and prescription medicines only as told by your health care provider. Do not rub or massage the injection site. Track your menstrual periods so you will know if they become irregular. Always use a condom to protect against sexually transmitted infections (STIs). Make sure you schedule an appointment in time for your next shot and mark it on your calendar. You must get an injection every 3 months (12-13 weeks) to prevent pregnancy. Lifestyle Do not use any products that contain nicotine or tobacco. These products include cigarettes, chewing tobacco, and vaping devices, such as e-cigarettes. If you need help quitting, ask your health care provider. Eat foods that are high in calcium and vitamin D, such as milk, cheese, and salmon. Doing this may help with any loss in bone density caused by the contraceptive injection. Ask your health care provider for dietary recommendations. Contact a health care provider if you: Have nausea or vomiting. Have abnormal vaginal discharge or bleeding. Miss a menstrual period or think you might be pregnant. Experience mood changes   or depression. Feel dizzy or light-headed. Have leg pain. Get help right away if you: Have chest pain or cough up blood. Have shortness of breath. Have a severe headache that does  not go away. Have numbness in any part of your body. Have slurred speech or vision problems. Have vaginal bleeding that is abnormally heavy or does not stop, or you have severe pain in your abdomen. Have depression that does not get better with treatment. If you ever feel like you may hurt yourself or others, or have thoughts about taking your own life, get help right away. Go to your nearest emergency department or: Call your local emergency services (911 in the [place] Call a suicide crisis helpline, such as the National Suicide Prevention Lifeline at 1-800-273-8255 or 988 in the [place] This is open 24 hours a day in the [place] Text the Crisis Text Line at 741741 (in the [place] Summary A contraceptive injection is a shot that prevents pregnancy. It is also called the birth control shot. The shot is given under the skin (subcutaneous) or into a muscle (intramuscular). After this procedure, it is common to have soreness around the injection site for a couple of days. To prevent pregnancy, the shot must be given by a health care provider every 3 months (12-13 weeks). After you have the shot, ask your health care provider whether you need to use an added method of birth control (backup contraception), such as a condom, sponge, or spermicide. This information is not intended to replace advice given to you by your health care provider. Make sure you discuss any questions you have with your health care provider. Document Revised: 08/18/2020 Document Reviewed: 08/04/2019 Elsevier Patient Education  2024 [employer]  

## 2022-08-31 ENCOUNTER — Other Ambulatory Visit (HOSPITAL_COMMUNITY)
Admission: RE | Admit: 2022-08-31 | Discharge: 2022-08-31 | Disposition: A | Discharge status: Home / Self Care | Payer: 59 | Source: Ambulatory Visit

## 2022-08-31 ENCOUNTER — Ambulatory Visit: Payer: 59 | Admitting: Obstetrics

## 2022-08-31 ENCOUNTER — Encounter: Payer: Self-pay | Admitting: Obstetrics

## 2022-08-31 VITALS — BP 105/71 | HR 103 | Ht 62.0 in | Wt 195.0 lb

## 2022-08-31 DIAGNOSIS — Z113 Encounter for screening for infections with a predominantly sexual mode of transmission: Secondary | ICD-10-CM

## 2022-08-31 DIAGNOSIS — Z01419 Encounter for gynecological examination (general) (routine) without abnormal findings: Secondary | ICD-10-CM

## 2022-08-31 MED ORDER — HYDROXYZINE HCL 25 MG PO TABS: 25.0000 mg | ORAL_TABLET | Freq: Three times a day (TID) | ORAL | 2 refills | Status: AC | PRN

## 2022-08-31 NOTE — Progress Notes (Signed)
ANNUAL GYNECOLOGICAL EXAM  SUBJECTIVE  HPI  Desiree Gaines is a 37 y.o.-year-old G2P1002 who presents for an annual gynecological exam today.  She denies pelvic pain, dyspareunia, abnormal vaginal bleeding or discharge, and UTI symptoms. She is not currently sexually active. She is anemic and has been getting iron infusions. She has difficulty falling asleep most nights.  Medical/Surgical History Past Medical History:  Diagnosis Date   Adrenal nodule (HCC)    Anemia    Cancer (HCC)    Graves disease    Hypothyroidism    Pituitary tumor    Thyroid cancer (HCC)    Thyroid disease    Umbilical hernia    Past Surgical History:  Procedure Laterality Date   CESAREAN SECTION  06/2011   x 2   COLONOSCOPY WITH PROPOFOL N/A 07/18/2021   Procedure: COLONOSCOPY WITH PROPOFOL;  Surgeon: Wyline Mood, MD;  Location: [facility] ENDOSCOPY;  Service: Gastroenterology;  Laterality: N/A;   tubes in ears Bilateral     Social History Lives with her 2 children, age 20 and 44. Feels safe there Work: [employer] Exercise: none Substances: Denies EtOH, tobacco, vape, and recreational drugs  Obstetric History OB History     Gravida  2   Para  2   Term  1   Preterm  0   AB  0   Living  2      SAB      IAB      Ectopic      Multiple      Live Births  2            GYN/Menstrual History No LMP recorded (lmp unknown). Patient has had an injection. Does not get regular periods with Depo Last Pap: 2023 Contraception: Depo  Prevention Dentist: does not go d/t anxiety Eye exam: does not go d/t anxiety Mammogram: at 42 Colonoscopy: done in 2023  Current Medications Outpatient Medications Prior to Visit  Medication Sig   cyanocobalamin (VITAMIN B12) 1000 MCG tablet Take 1,000 mcg by mouth daily. Gummy otc   ferrous sulfate 325 (65 FE) MG tablet TAKE 1 TABLET (325 MG)BY MOUTH DAILY   folic acid (FOLVITE) 1 MG tablet Take 1 tablet (1 mg total) by mouth daily.   levothyroxine  (SYNTHROID) 125 MCG tablet Take 1 tablet (125 mcg total) by mouth daily.   medroxyPROGESTERone (DEPO-PROVERA) 150 MG/ML injection INJECT 1 ML (150 MG TOTAL) INTO THE MUSCLE EVERY 3 (THREE) MONTHS   No facility-administered medications prior to visit.      The pregnancy intention screening data noted above was reviewed. Potential methods of contraception were discussed. The patient elected to proceed with No data recorded.   ROS Constitutional: Denied constitutional symptoms, night sweats, recent illness, fatigue, fever, insomnia and weight loss.  Eyes: Denied eye symptoms, eye pain, photophobia, vision change and visual disturbance.  Ears/Nose/Throat/Neck: Denied ear, nose, throat or neck symptoms, hearing loss, nasal discharge, sinus congestion and sore throat.  Cardiovascular: Denied cardiovascular symptoms, arrhythmia, chest pain/pressure, edema, exercise intolerance, orthopnea and palpitations.  Respiratory: Denied pulmonary symptoms, asthma, pleuritic pain, productive sputum, cough, dyspnea and wheezing.  Gastrointestinal: Denied, gastro-esophageal reflux, melena, nausea and vomiting.  Genitourinary: Denied genitourinary symptoms including symptomatic vaginal discharge, pelvic relaxation issues, and urinary complaints.  Musculoskeletal: Denied musculoskeletal symptoms, stiffness, swelling, muscle weakness and myalgia.  Dermatologic: Denied dermatology symptoms, rash and scar.  Neurologic: Denied neurology symptoms, dizziness, headache, neck pain and syncope.  Psychiatric: Denied psychiatric symptoms, anxiety and depression.  Endocrine: Denied  endocrine symptoms including hot flashes and night sweats.    OBJECTIVE  BP (!) 129/91   Pulse (!) 108   Ht 5\' 2"  (1.575 m)   Wt 195 lb (88.5 kg)   LMP  (LMP Unknown)   BMI 35.67 kg/m    Physical examination General NAD, Conversant  HEENT Atraumatic; Op clear with mmm.  Normo-cephalic. Pupils reactive. Anicteric sclerae  Thyroid/Neck  Smooth without nodularity or enlargement. Normal ROM.  Neck Supple.  Skin No rashes, lesions or ulceration. Normal palpated skin turgor. No nodularity.  Breasts: No masses or discharge.  Symmetric.  No axillary adenopathy. 2 cm round raised area on left breast noted - soft, non-tender, slightly pigmented, with small pustule.  Lungs: Clear to auscultation.No rales or wheezes. Normal Respiratory effort, no retractions.  Heart: NSR.  No murmurs or rubs appreciated. No peripheral edema  Abdomen: Soft.  Mildly tender.  Poor abdominal tone, large DR.  Extremities: Moves all appropriately.  Normal ROM for age. No lymphadenopathy.  Neuro: Oriented to PPT.  Normal mood. Normal affect.     Pelvic: Declined    ASSESSMENT  1) Annual exam 2) Insomnia/anxiety 3) Skin changes on left breast  PLAN 1) Physical exam as noted. Discussed healthy lifestyle choices and preventive care. Urine STI testing done today (declines swabs and blood tests). Encouraged regular dental checkups and eye exams. Pap due in 2028. 2) Discussed sleep hygiene, meditation, decreased screen time, physical activity during the day. Rx for hydroxyzine sent. 3) Roshonda states that the raised area has been present on her breast for "a while" now. Recommend evaluation by dermatology. She declines at this time but states she will reach out if she sees changes in it or would like a referral.  Return in one year for annual exam or as needed for concerns.   Guadlupe Spanish, CNM

## 2022-09-05 ENCOUNTER — Encounter: Payer: Self-pay | Admitting: Obstetrics

## 2022-09-11 ENCOUNTER — Other Ambulatory Visit: Payer: Self-pay

## 2022-09-11 ENCOUNTER — Inpatient Hospital Stay: Payer: 59 | Attending: Internal Medicine

## 2022-09-11 ENCOUNTER — Other Ambulatory Visit: Payer: Self-pay | Admitting: *Deleted

## 2022-09-11 DIAGNOSIS — D649 Anemia, unspecified: Secondary | ICD-10-CM

## 2022-09-11 DIAGNOSIS — E538 Deficiency of other specified B group vitamins: Secondary | ICD-10-CM | POA: Diagnosis not present

## 2022-09-11 DIAGNOSIS — D508 Other iron deficiency anemias: Secondary | ICD-10-CM

## 2022-09-11 DIAGNOSIS — D509 Iron deficiency anemia, unspecified: Secondary | ICD-10-CM | POA: Diagnosis not present

## 2022-09-11 LAB — FERRITIN: Ferritin: 87 ng/mL (ref 11–307)

## 2022-09-11 LAB — IRON AND TIBC
Iron: 94 ug/dL (ref 28–170)
Saturation Ratios: 26 % (ref 10.4–31.8)
TIBC: 356 ug/dL (ref 250–450)
UIBC: 262 ug/dL

## 2022-09-11 LAB — CBC WITH DIFFERENTIAL/PLATELET
Abs Immature Granulocytes: 0.06 10*3/uL (ref 0.00–0.07)
Basophils Absolute: 0.1 10*3/uL (ref 0.0–0.1)
Basophils Relative: 1 %
Eosinophils Absolute: 0.1 10*3/uL (ref 0.0–0.5)
Eosinophils Relative: 1 %
HCT: 34.2 % — ABNORMAL LOW (ref 36.0–46.0)
Hemoglobin: 11.1 g/dL — ABNORMAL LOW (ref 12.0–15.0)
Immature Granulocytes: 1 %
Lymphocytes Relative: 38 %
Lymphs Abs: 2.1 10*3/uL (ref 0.7–4.0)
MCH: 32.2 pg (ref 26.0–34.0)
MCHC: 32.5 g/dL (ref 30.0–36.0)
MCV: 99.1 fL (ref 80.0–100.0)
Monocytes Absolute: 0.5 10*3/uL (ref 0.1–1.0)
Monocytes Relative: 8 %
Neutro Abs: 2.8 10*3/uL (ref 1.7–7.7)
Neutrophils Relative %: 51 %
Platelets: 249 10*3/uL (ref 150–400)
RBC: 3.45 MIL/uL — ABNORMAL LOW (ref 3.87–5.11)
RDW: 14.4 % (ref 11.5–15.5)
WBC: 5.5 10*3/uL (ref 4.0–10.5)
nRBC: 0 % (ref 0.0–0.2)

## 2022-09-11 LAB — VITAMIN B12: Vitamin B-12: 276 pg/mL (ref 180–914)

## 2022-09-11 LAB — FOLATE: Folate: 4 ng/mL — ABNORMAL LOW (ref >5.9)

## 2022-09-12 ENCOUNTER — Other Ambulatory Visit: Payer: 59

## 2022-09-12 ENCOUNTER — Inpatient Hospital Stay: Payer: 59

## 2022-09-12 ENCOUNTER — Inpatient Hospital Stay: Payer: 59 | Admitting: Internal Medicine

## 2022-09-12 VITALS — BP 130/90 | HR 93 | Temp 98.9°F | Wt 190.0 lb

## 2022-09-12 DIAGNOSIS — D500 Iron deficiency anemia secondary to blood loss (chronic): Secondary | ICD-10-CM

## 2022-09-12 DIAGNOSIS — D649 Anemia, unspecified: Secondary | ICD-10-CM

## 2022-09-12 DIAGNOSIS — Z862 Personal history of diseases of the blood and blood-forming organs and certain disorders involving the immune mechanism: Secondary | ICD-10-CM

## 2022-09-12 DIAGNOSIS — E538 Deficiency of other specified B group vitamins: Secondary | ICD-10-CM | POA: Diagnosis not present

## 2022-09-12 DIAGNOSIS — D509 Iron deficiency anemia, unspecified: Secondary | ICD-10-CM | POA: Diagnosis not present

## 2022-09-12 MED ORDER — FOLIC ACID 1 MG PO TABS
1.0000 mg | ORAL_TABLET | Freq: Every day | ORAL | 1 refills | Status: DC
Start: 2022-09-12 — End: 2023-10-16

## 2022-09-12 NOTE — Progress Notes (Signed)
Patient is about the same, noting really new to report to Dr. Alena Bills today. She did say her appetite isn't all that great, having some neuropathy in mostly in her right hand, sometimes in her left.

## 2022-09-12 NOTE — Progress Notes (Signed)
[facility]  [phone] [phone]) [phone]  ID: Desiree Gaines OB: Feb 09, 1985  MR#: [id number]  KZS#:[id number]  Patient Care Team: Duanne Limerick, MD as PCP - General (Family Medicine) Bridgett Larsson, LCSW as Social Worker (Licensed Clinical Social Worker) Michaelyn Barter, MD as Consulting Physician (Oncology)  REFERRING PROVIDER: Dr. Yetta Barre  REASON FOR REFERRAL: Iron deficiency anemia  HPI: Desiree Gaines is a 37 y.o. female with past medical history of Graves' disease was referred to hematology for management of iron deficiency anemia  Patient has long standing history of anemia.  Most recent blood work from 05/10/2022 showed hemoglobin 8.6, MCV 80, platelets 364 and WBC 7.7.  Ferritin 12.  She could not tolerate oral iron due to GI upset.  Has history of heavy menstrual period which has significantly improved after she was started on Depo sometime in August 2023.  She had colonoscopy on 07/18/2021 when Dr. Tobi Bastos followed-up for volvulus.  Showed 5 mm polyp in ascending colon otherwise unremarkable.  She was scheduled for endoscopy but had to be canceled since she did not have anyone to drive her for the procedure.  Interval history Patient was seen today as follow-up for anemia and labs. She tolerated iron infusions well.  Reports improvement in her energy level.  Continues to be on Depo injections and her.'s are very light.  She was scheduled for endoscopy with Dr. Tobi Bastos but canceled it since she did not have anyone to drive her to the procedure.  Taking B12 Gummies but not every day.   REVIEW OF SYSTEMS:   ROS  As per HPI. Otherwise, a complete review of systems is negative.  PAST MEDICAL HISTORY: Past Medical History:  Diagnosis Date   Adrenal nodule (HCC)    Anemia    Cancer (HCC)    Graves disease    Hypothyroidism    Pituitary tumor    Thyroid cancer (HCC)    Thyroid disease    Umbilical hernia     PAST SURGICAL  HISTORY: Past Surgical History:  Procedure Laterality Date   CESAREAN SECTION  06/2011   x 2   COLONOSCOPY WITH PROPOFOL N/A 07/18/2021   Procedure: COLONOSCOPY WITH PROPOFOL;  Surgeon: Wyline Mood, MD;  Location: [facility] ENDOSCOPY;  Service: Gastroenterology;  Laterality: N/A;   tubes in ears Bilateral     FAMILY HISTORY: Family History  Problem Relation Age of Onset   Diabetes Mother    Hypertension Mother    Stroke Mother    Prostate cancer Father    Hypertension Sister    Breast cancer Sister    Heart disease Brother     HEALTH MAINTENANCE: Social History   Tobacco Use   Smoking status: Never    Passive exposure: Never   Smokeless tobacco: Never  Vaping Use   Vaping status: Never Used  Substance Use Topics   Alcohol use: Not Currently   Drug use: Never     No Known Allergies  Current Outpatient Medications  Medication Sig Dispense Refill   cyanocobalamin (VITAMIN B12) 1000 MCG tablet Take 1,000 mcg by mouth daily. Gummy otc     hydrOXYzine (ATARAX) 25 MG tablet Take 1 tablet (25 mg total) by mouth every 8 (eight) hours as needed for itching (anxiety or insomnia). 30 tablet 2   levothyroxine (SYNTHROID) 125 MCG tablet Take 1 tablet (125 mcg total) by mouth daily. 90 tablet 1   medroxyPROGESTERone (DEPO-PROVERA) 150 MG/ML injection INJECT 1 ML (150 MG TOTAL) INTO THE MUSCLE  EVERY 3 (THREE) MONTHS 1 mL 0   folic acid (FOLVITE) 1 MG tablet Take 1 tablet (1 mg total) by mouth daily. 90 tablet 1   No current facility-administered medications for this visit.    OBJECTIVE: Vitals:   09/12/22 1350  BP: (!) 130/90  Pulse: 93  Temp: 98.9 F (37.2 C)  SpO2: 100%      Body mass index is 34.75 kg/m.      General: Well-developed, well-nourished, no acute distress. Eyes: Pink conjunctiva, anicteric sclera. HEENT: Normocephalic, moist mucous membranes, clear oropharnyx. Lungs: Clear to auscultation bilaterally. Heart: Regular rate and rhythm. No rubs, murmurs, or  gallops. Abdomen: Soft, nontender, nondistended. No organomegaly noted, normoactive bowel sounds. Musculoskeletal: No edema, cyanosis, or clubbing. Neuro: Alert, answering all questions appropriately. Cranial nerves grossly intact. Skin: No rashes or petechiae noted. Psych: Normal affect. Lymphatics: No cervical, calvicular, axillary or inguinal LAD.   LAB RESULTS:  Lab Results  Component Value Date   NA 139 05/19/2021   K 4.5 05/19/2021   CL 105 05/19/2021   CO2 20 05/19/2021   GLUCOSE 99 05/19/2021   BUN 11 05/19/2021   CREATININE 1.17 (H) 05/19/2021   CALCIUM 10.5 (H) 05/19/2021   PROT 6.9 08/07/2020   ALBUMIN 4.9 (H) 05/19/2021   AST 16 08/07/2020   ALT 25 08/07/2020   ALKPHOS 112 08/07/2020   BILITOT 0.6 08/07/2020   GFRNONAA >60 08/07/2020   GFRAA >60 07/17/2011    Lab Results  Component Value Date   WBC 5.5 09/11/2022   NEUTROABS 2.8 09/11/2022   HGB 11.1 (L) 09/11/2022   HCT 34.2 (L) 09/11/2022   MCV 99.1 09/11/2022   PLT 249 09/11/2022    Lab Results  Component Value Date   TIBC 356 09/11/2022   TIBC 363 05/10/2021   TIBC 297 08/04/2020   FERRITIN 87 09/11/2022   FERRITIN 12 (L) 05/10/2022   FERRITIN 216 (H) 05/19/2021   IRONPCTSAT 26 09/11/2022   IRONPCTSAT 18 05/10/2021   IRONPCTSAT 28 08/04/2020     STUDIES: No results found.  ASSESSMENT AND PLAN:   Desiree Gaines is a 37 y.o. female with pmh of Graves' disease was referred to hematology for management of iron deficiency anemia.  # Iron deficiency anemia -Could not tolerate oral iron due to GI upset.  Completed IV Venofer 200 mg weekly x 5 doses in May 2024.  Hemoglobin has improved from 8.9-11.1.  Iron panel is normal now. -colonoscopy done in June 2023 by Dr. Tobi Bastos for volvulus which showed 5 mm polyp in the ascending colon otherwise was unremarkable.  She was scheduled for endoscopy but then canceled it because she did not have anyone to drive her for the procedure. -Has history of  heavy menstrual cycles.  On Depo injections since August 2023 and cycles have been very light.  # Folic acid deficiency -Folic acid 4.0.  Sent a prescription for folic acid 1 mg daily  # Borderline low vitamin B12 -B12 level 276.  Advised to take B12 1000 mcg Gummies every day.   Orders Placed This Encounter  Procedures   CBC with Differential/Platelet   Iron and TIBC   Ferritin   Folate   Vitamin B12   RTC in 5 months for MD visit, labs 2 days prior.  Patient expressed understanding and was in agreement with this plan. She also understands that She can call clinic at any time with any questions, concerns, or complaints.   I spent a total of 30 minutes  reviewing chart data, face-to-face evaluation with the patient, counseling and coordination of care as detailed above.  Michaelyn Barter, MD   09/12/2022 2:20 PM

## 2022-10-02 ENCOUNTER — Telehealth: Payer: Self-pay

## 2022-10-02 ENCOUNTER — Telehealth: Payer: Self-pay | Admitting: Family Medicine

## 2022-10-02 NOTE — Telephone Encounter (Signed)
Copied from CRM [id number]. Topic: General - Other >> Sep 27, 2022  5:43 PM Everette C wrote: Reason for CRM: Drinda Butts with [facility] will be sending personal care paperwork to the practice on 10/27/22  Please call Annette at [phone] when possible to discuss if needed

## 2022-10-02 NOTE — Telephone Encounter (Signed)
Called and left message on Desiree Gaines's vm concerning a call from [facility] with [facility] at [phone]. I am unsure what paperwork she is speaking about

## 2022-10-27 ENCOUNTER — Ambulatory Visit: Payer: 59 | Admitting: Family Medicine

## 2022-10-31 ENCOUNTER — Encounter: Payer: Self-pay | Admitting: Family Medicine

## 2022-10-31 ENCOUNTER — Ambulatory Visit (INDEPENDENT_AMBULATORY_CARE_PROVIDER_SITE_OTHER): Payer: 59 | Admitting: Family Medicine

## 2022-10-31 VITALS — BP 128/64 | HR 80 | Ht 62.0 in | Wt 204.0 lb

## 2022-10-31 DIAGNOSIS — E039 Hypothyroidism, unspecified: Secondary | ICD-10-CM | POA: Diagnosis not present

## 2022-10-31 DIAGNOSIS — Z91199 Patient's noncompliance with other medical treatment and regimen due to unspecified reason: Secondary | ICD-10-CM

## 2022-10-31 MED ORDER — LEVOTHYROXINE SODIUM 125 MCG PO TABS
125.0000 ug | ORAL_TABLET | Freq: Every day | ORAL | 1 refills | Status: DC
Start: 2022-10-31 — End: 2022-11-17

## 2022-10-31 NOTE — Progress Notes (Signed)
Date:  10/31/2022   Name:  Desiree Gaines   DOB:  1985/12/18   MRN:  [id number]   Chief Complaint: Hypothyroidism  Thyroid Problem Presents for follow-up visit. Symptoms include cold intolerance, fatigue, hair loss and weight gain. Patient reports no anxiety, constipation, diaphoresis, diarrhea, dry skin, heat intolerance, leg swelling, nail problem, palpitations, visual change or weight loss. The symptoms have been stable.    Lab Results  Component Value Date   NA 139 05/19/2021   K 4.5 05/19/2021   CO2 20 05/19/2021   GLUCOSE 99 05/19/2021   BUN 11 05/19/2021   CREATININE 1.17 (H) 05/19/2021   CALCIUM 10.5 (H) 05/19/2021   EGFR 63 05/19/2021   GFRNONAA >60 08/07/2020   Lab Results  Component Value Date   CHOL 173 05/19/2021   HDL 49 05/19/2021   LDLCALC 95 05/19/2021   TRIG 171 (H) 05/19/2021   Lab Results  Component Value Date   TSH 29.900 (H) 05/10/2022   No results found for: "HGBA1C" Lab Results  Component Value Date   WBC 5.5 09/11/2022   HGB 11.1 (L) 09/11/2022   HCT 34.2 (L) 09/11/2022   MCV 99.1 09/11/2022   PLT 249 09/11/2022   Lab Results  Component Value Date   ALT 25 08/07/2020   AST 16 08/07/2020   ALKPHOS 112 08/07/2020   BILITOT 0.6 08/07/2020   No results found for: "25OHVITD2", "25OHVITD3", "VD25OH"   Review of Systems  Constitutional:  Positive for fatigue and weight gain. Negative for diaphoresis and weight loss.  Eyes:  Negative for redness and visual disturbance.  Respiratory:  Negative for choking, shortness of breath and wheezing.   Cardiovascular: Negative.  Negative for palpitations and leg swelling.  Gastrointestinal:  Negative for blood in stool, constipation and diarrhea.  Endocrine: Positive for cold intolerance. Negative for heat intolerance.  Psychiatric/Behavioral:  The patient is not nervous/anxious.     Patient Active Problem List   Diagnosis Date Noted   Folate deficiency 09/12/2022   History of anemia  10/27/2021   Diastasis of rectus abdominis 10/27/2021   Chronic constipation 09/02/2020   Encounter for screening colonoscopy 09/02/2020   Sigmoid volvulus (HCC) 09/02/2020   Ventral hernia without obstruction or gangrene 08/12/2020   Facial cellulitis 08/02/2020   Hypothyroidism 08/02/2020   Acute posthemorrhagic anemia 07/09/2011   Delivery with history of cesarean section 07/08/2011   Other postprocedural status(V45.89) 07/08/2011   Iron deficiency anemia 03/06/2011   Maternal thyroid dysfunction, antepartum 01/21/2007   Developmental delay 06/19/2000    No Known Allergies  Past Surgical History:  Procedure Laterality Date   CESAREAN SECTION  06/2011   x 2   COLONOSCOPY WITH PROPOFOL N/A 07/18/2021   Procedure: COLONOSCOPY WITH PROPOFOL;  Surgeon: Wyline Mood, MD;  Location: [facility] ENDOSCOPY;  Service: Gastroenterology;  Laterality: N/A;   tubes in ears Bilateral     Social History   Tobacco Use   Smoking status: Never    Passive exposure: Never   Smokeless tobacco: Never  Vaping Use   Vaping status: Never Used  Substance Use Topics   Alcohol use: Not Currently   Drug use: Never     Medication list has been reviewed and updated.  Current Meds  Medication Sig   cyanocobalamin (VITAMIN B12) 1000 MCG tablet Take 1,000 mcg by mouth daily. Gummy otc   folic acid (FOLVITE) 1 MG tablet Take 1 tablet (1 mg total) by mouth daily.   hydrOXYzine (ATARAX) 25 MG tablet Take 1  tablet (25 mg total) by mouth every 8 (eight) hours as needed for itching (anxiety or insomnia).   levothyroxine (SYNTHROID) 125 MCG tablet Take 1 tablet (125 mcg total) by mouth daily.   medroxyPROGESTERone (DEPO-PROVERA) 150 MG/ML injection INJECT 1 ML (150 MG TOTAL) INTO THE MUSCLE EVERY 3 (THREE) MONTHS       10/31/2022    3:52 PM 04/27/2022    2:11 PM 12/26/2021    3:48 PM 10/27/2021    2:57 PM  GAD 7 : Generalized Anxiety Score  Nervous, Anxious, on Edge 0 0 0 0  Control/stop worrying 0 0 0 0   Worry too much - different things 0 0 0 0  Trouble relaxing 0 0 0 0  Restless 0 0 0 0  Easily annoyed or irritable 0 0 0 0  Afraid - awful might happen 0 0 0 0  Total GAD 7 Score 0 0 0 0  Anxiety Difficulty Not difficult at all Not difficult at all Not difficult at all Not difficult at all       10/31/2022    3:51 PM 05/15/2022    2:35 PM 04/27/2022    2:10 PM  Depression screen PHQ 2/9  Decreased Interest 0 2 0  Down, Depressed, Hopeless 0 2 0  PHQ - 2 Score 0 4 0  Altered sleeping 0  0  Tired, decreased energy 0  0  Change in appetite 0  0  Feeling bad or failure about yourself  0  0  Trouble concentrating 0  0  Moving slowly or fidgety/restless 0  0  Suicidal thoughts 0  0  PHQ-9 Score 0  0  Difficult doing work/chores Not difficult at all  Not difficult at all    BP Readings from Last 3 Encounters:  10/31/22 128/64  09/12/22 (!) 130/90  08/31/22 105/71    Physical Exam Vitals and nursing note reviewed. Exam conducted with a chaperone present.  Constitutional:      General: She is not in acute distress.    Appearance: She is not diaphoretic.  HENT:     Head: Normocephalic and atraumatic.     Right Ear: Tympanic membrane and external ear normal.     Left Ear: Tympanic membrane and external ear normal.     Nose: Nose normal. No congestion or rhinorrhea.  Eyes:     General:        Right eye: No discharge.        Left eye: No discharge.     Conjunctiva/sclera: Conjunctivae normal.     Pupils: Pupils are equal, round, and reactive to light.  Neck:     Thyroid: No thyromegaly.     Vascular: No JVD.  Cardiovascular:     Rate and Rhythm: Normal rate and regular rhythm.     Heart sounds: Normal heart sounds. No murmur heard.    No friction rub. No gallop.  Pulmonary:     Effort: Pulmonary effort is normal.     Breath sounds: Normal breath sounds.  Abdominal:     General: Bowel sounds are normal.     Palpations: Abdomen is soft. There is no mass.     Tenderness:  There is no abdominal tenderness. There is no guarding.  Musculoskeletal:        General: Normal range of motion.     Cervical back: Normal range of motion and neck supple.  Lymphadenopathy:     Cervical: No cervical adenopathy.  Skin:    General:  Skin is warm and dry.  Neurological:     Mental Status: She is alert.     Deep Tendon Reflexes: Reflexes are normal and symmetric.     Wt Readings from Last 3 Encounters:  10/31/22 204 lb (92.5 kg)  09/12/22 190 lb (86.2 kg)  08/31/22 195 lb (88.5 kg)    BP 128/64   Pulse 80   Ht 5\' 2"  (1.575 m)   Wt 204 lb (92.5 kg)   SpO2 96%   BMI 37.31 kg/m   Assessment and Plan:  1. Acquired hypothyroidism Chronic.  Controlled.  Stable.  Patient is likely going to have an elevated TSH close once again she is not taking her levothyroxine on a regular basis.  Will check thyroid panel with TSH today and refill the levothyroxine and likely resume at current dosing with patient to return in 6 months. - Thyroid Panel With TSH - levothyroxine (SYNTHROID) 125 MCG tablet; Take 1 tablet (125 mcg total) by mouth daily.  Dispense: 90 tablet; Refill: 1  2. Noncompliance Patient is chronically noncompliant will not take her medications on a regular basis.  We have discussed getting a pillbox that someone would be able to refill on a daily basis which she is to take over 2 weeks and patient is supposed to obtain this.   Elizabeth Sauer, MD

## 2022-11-01 LAB — THYROID PANEL WITH TSH
T3 Uptake Ratio: <2 % — ABNORMAL LOW (ref 24–39)
T4, Total: 0.8 ug/dL — CL (ref 4.5–12.0)
TSH: 94.9 u[IU]/mL — ABNORMAL HIGH (ref 0.450–4.500)

## 2022-11-01 NOTE — Progress Notes (Signed)
PC to pt, discussed labs and importance in taking medicaiton, pt voiced understanding and will be here for appointment on 11/16/22

## 2022-11-06 ENCOUNTER — Telehealth: Payer: Self-pay

## 2022-11-06 NOTE — Telephone Encounter (Signed)
Called pt let her know we received form doe PCS. Told pt insurance does not cover this servce that she would need to call and set something up with home service to pay out of pocket. Pt stated she would call and let them know  [facility] [phone] P [phone] F

## 2022-11-07 ENCOUNTER — Telehealth: Payer: Self-pay | Admitting: Family Medicine

## 2022-11-07 NOTE — Telephone Encounter (Signed)
Patient informed Dr Yetta Barre does not completed PCS forms. Patient will need to discuss with the facility and pay out of pocket if wanted for these services.  - Fedra Lanter

## 2022-11-07 NOTE — Telephone Encounter (Signed)
Copied from CRM [id number]. Topic: General - Other >> Nov 07, 2022  2:09 PM Everette C wrote: Reason for CRM: Drinda Butts with [facility] has called regarding the patient's PCS 3051 form   Drinda Butts has called to follow up on paperwork and the patient's request for personal care services   Please contact further when possible

## 2022-11-09 NOTE — Telephone Encounter (Addendum)
Associate Professor NPI- [id number] with [facility] stating that patient is requesting Patient Care Services. Drinda Butts is stating if the patient has full medicaid patient is allowed to get PCS. Drinda Butts was advised by the patient that she has full medicaid. [id number]

## 2022-11-13 ENCOUNTER — Ambulatory Visit: Payer: 59

## 2022-11-13 VITALS — BP 126/90 | HR 98 | Ht 62.0 in | Wt 202.0 lb

## 2022-11-13 DIAGNOSIS — Z3042 Encounter for surveillance of injectable contraceptive: Secondary | ICD-10-CM | POA: Diagnosis not present

## 2022-11-13 MED ORDER — MEDROXYPROGESTERONE ACETATE 150 MG/ML IM SUSY
150.0000 mg | PREFILLED_SYRINGE | Freq: Once | INTRAMUSCULAR | Status: AC
Start: 2022-11-13 — End: 2022-11-13
  Administered 2022-11-13: 150 mg via INTRAMUSCULAR

## 2022-11-13 NOTE — Progress Notes (Signed)
NURSE VISIT NOTE  Subjective:    Patient ID: Desiree Gaines, female    DOB: [birth year], 37 y.o.   MRN: [id number]  HPI  Patient is a 37 y.o. G45P1002 female who presents for depo provera injection. BP is high in office pt stated she has apt on Thursday with her PCP in [facility] stated she use to take BP medication when she was in middle school and will let pcp know.    Objective:    BP (!) 126/90   Pulse 98   Ht 5\' 2"  (1.575 m)   Wt 202 lb (91.6 kg)   BMI 36.95 kg/m   /Last Annual: 08/31/22 Last pap: 09/19/21. Last Depo-Provera: 17/15/24. Side Effects if any: none. Serum HCG indicated? No . Depo-Provera 150 mg IM given by: Beverely Pace, CMA. Site: Right Ventrogluteal   Assessment:   1. Encounter for Depo-Provera contraception      Plan:   Next appointment due between DEC 23-jan 6     Loney Laurence, CMA

## 2022-11-13 NOTE — Telephone Encounter (Signed)
Called and left VM for Desiree Gaines informing her that Dr Yetta Barre does not complete PCS forms.  - Desiree Gaines

## 2022-11-16 ENCOUNTER — Encounter: Payer: Self-pay | Admitting: Family Medicine

## 2022-11-16 ENCOUNTER — Ambulatory Visit (INDEPENDENT_AMBULATORY_CARE_PROVIDER_SITE_OTHER): Payer: 59 | Admitting: Family Medicine

## 2022-11-16 VITALS — BP 142/78 | HR 64 | Ht 62.0 in | Wt 202.0 lb

## 2022-11-16 DIAGNOSIS — E039 Hypothyroidism, unspecified: Secondary | ICD-10-CM

## 2022-11-16 DIAGNOSIS — Z79899 Other long term (current) drug therapy: Secondary | ICD-10-CM | POA: Diagnosis not present

## 2022-11-16 NOTE — Progress Notes (Addendum)
Date:  11/16/2022   Name:  Desiree Gaines   DOB:  [birth year]   MRN:  [id number]   Chief Complaint: Hypothyroidism  Thyroid Problem Presents for follow-up visit. Symptoms include anxiety, cold intolerance and fatigue. Patient reports no constipation, depressed mood, diaphoresis, diarrhea, dry skin, hair loss, heat intolerance, hoarse voice, leg swelling, menstrual problem, nail problem, palpitations, tremors, visual change, weight gain or weight loss. The symptoms have been improving.    Lab Results  Component Value Date   NA 139 05/19/2021   K 4.5 05/19/2021   CO2 20 05/19/2021   GLUCOSE 99 05/19/2021   BUN 11 05/19/2021   CREATININE 1.17 (H) 05/19/2021   CALCIUM 10.5 (H) 05/19/2021   EGFR 63 05/19/2021   GFRNONAA >60 08/07/2020   Lab Results  Component Value Date   CHOL 173 05/19/2021   HDL 49 05/19/2021   LDLCALC 95 05/19/2021   TRIG 171 (H) 05/19/2021   Lab Results  Component Value Date   TSH 94.900 (H) 10/31/2022   No results found for: "HGBA1C" Lab Results  Component Value Date   WBC 5.5 09/11/2022   HGB 11.1 (L) 09/11/2022   HCT 34.2 (L) 09/11/2022   MCV 99.1 09/11/2022   PLT 249 09/11/2022   Lab Results  Component Value Date   ALT 25 08/07/2020   AST 16 08/07/2020   ALKPHOS 112 08/07/2020   BILITOT 0.6 08/07/2020   No results found for: "25OHVITD2", "25OHVITD3", "VD25OH"   Review of Systems  Constitutional:  Positive for fatigue. Negative for diaphoresis, weight gain and weight loss.  HENT:  Negative for hoarse voice and trouble swallowing.   Respiratory:  Negative for cough, shortness of breath and wheezing.   Cardiovascular:  Negative for chest pain, palpitations and leg swelling.  Gastrointestinal:  Negative for abdominal pain, constipation and diarrhea.  Endocrine: Positive for cold intolerance. Negative for heat intolerance.  Genitourinary:  Negative for menstrual problem.  Neurological:  Negative for tremors.  Psychiatric/Behavioral:   The patient is nervous/anxious.     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   Ventral hernia without obstruction or gangrene 08/12/2020   Facial cellulitis 08/02/2020   Hypothyroidism 08/02/2020   Acute posthemorrhagic anemia 07/09/2011   Delivery with history of cesarean section 07/08/2011   Other postprocedural status(V45.89) 07/08/2011   Iron deficiency anemia 03/06/2011   Maternal thyroid dysfunction, antepartum 01/21/2007   Developmental delay 06/19/2000    No Known Allergies  Past Surgical History:  Procedure Laterality Date   CESAREAN SECTION  06/2011   x 2   COLONOSCOPY WITH PROPOFOL N/A 07/18/2021   Procedure: COLONOSCOPY WITH PROPOFOL;  Surgeon: Wyline Mood, MD;  Location: [facility] ENDOSCOPY;  Service: Gastroenterology;  Laterality: N/A;   tubes in ears Bilateral     Social History   Tobacco Use   Smoking status: Never    Passive exposure: Never   Smokeless tobacco: Never  Vaping Use   Vaping status: Never Used  Substance Use Topics   Alcohol use: Not Currently   Drug use: Never     Medication list has been reviewed and updated.  Current Meds  Medication Sig   cyanocobalamin (VITAMIN B12) 1000 MCG tablet Take 1,000 mcg by mouth daily. Gummy otc   folic acid (FOLVITE) 1  MG tablet Take 1 tablet (1 mg total) by mouth daily.   hydrOXYzine (ATARAX) 25 MG tablet Take 1 tablet (25 mg total) by mouth every 8 (eight) hours as needed for itching (anxiety or insomnia).   levothyroxine (SYNTHROID) 125 MCG tablet Take 1 tablet (125 mcg total) by mouth daily.   medroxyPROGESTERone (DEPO-PROVERA) 150 MG/ML injection INJECT 1 ML (150 MG TOTAL) INTO THE MUSCLE EVERY 3 (THREE) MONTHS       11/16/2022    3:41 PM 10/31/2022    3:52 PM  04/27/2022    2:11 PM 12/26/2021    3:48 PM  GAD 7 : Generalized Anxiety Score  Nervous, Anxious, on Edge 2 0 0 0  Control/stop worrying 3 0 0 0  Worry too much - different things 2 0 0 0  Trouble relaxing 1 0 0 0  Restless 0 0 0 0  Easily annoyed or irritable 0 0 0 0  Afraid - awful might happen 0 0 0 0  Total GAD 7 Score 8 0 0 0  Anxiety Difficulty Not difficult at all Not difficult at all Not difficult at all Not difficult at all       11/16/2022    3:41 PM 10/31/2022    3:51 PM 05/15/2022    2:35 PM  Depression screen PHQ 2/9  Decreased Interest 2 0 2  Down, Depressed, Hopeless 2 0 2  PHQ - 2 Score 4 0 4  Altered sleeping 3 0   Tired, decreased energy 2 0   Change in appetite 1 0   Feeling bad or failure about yourself  2 0   Trouble concentrating 0 0   Moving slowly or fidgety/restless 0 0   Suicidal thoughts 0 0   PHQ-9 Score 12 0   Difficult doing work/chores Not difficult at all Not difficult at all     BP Readings from Last 3 Encounters:  11/16/22 (!) 142/78  11/13/22 (!) 126/90  10/31/22 128/64    Physical Exam Vitals and nursing note reviewed. Exam conducted with a chaperone present.  Constitutional:      General: She is not in acute distress.    Appearance: She is not diaphoretic.  HENT:     Head: Normocephalic and atraumatic.     Right Ear: Tympanic membrane and external ear normal.     Left Ear: Tympanic membrane and external ear normal.     Nose: Nose normal.     Mouth/Throat:     Mouth: Mucous membranes are moist.  Eyes:     General:        Right eye: No discharge.        Left eye: No discharge.     Conjunctiva/sclera: Conjunctivae normal.     Pupils: Pupils are equal, round, and reactive to light.  Neck:     Thyroid: No thyromegaly.     Vascular: No JVD.  Cardiovascular:     Rate and Rhythm: Normal rate and regular rhythm.     Heart sounds: Normal heart sounds. No murmur heard.    No friction rub. No gallop.  Pulmonary:     Effort:  Pulmonary effort is normal.     Breath sounds: Normal breath sounds. No wheezing, rhonchi or rales.  Abdominal:     General: Abdomen is flat. Bowel sounds are normal.     Palpations: Abdomen is soft. There is no mass.     Tenderness: There is no abdominal tenderness. There is no guarding.  Musculoskeletal:  General: No deformity or signs of injury. Normal range of motion.     Cervical back: Normal range of motion and neck supple.  Lymphadenopathy:     Cervical: No cervical adenopathy.  Skin:    General: Skin is warm and dry.  Neurological:     Mental Status: She is alert.     Deep Tendon Reflexes: Reflexes are normal and symmetric.     Wt Readings from Last 3 Encounters:  11/16/22 202 lb (91.6 kg)  11/13/22 202 lb (91.6 kg)  10/31/22 204 lb (92.5 kg)    BP (!) 142/78   Pulse 64   Ht 5\' 2"  (1.575 m)   Wt 202 lb (91.6 kg)   SpO2 98%   BMI 36.95 kg/m   Assessment and Plan:  1. Acquired hypothyroidism Chronic.  Probably uncontrolled but at the dosing that we started her resumption of levothyroxine at the current dose in 125 mcg.  Patient seems to be a little bit more with it and not as hesitant with her thoughts and actions.  Answers have been appropriate and we have a normal exam.  We will check thyroid panel with TSH and adjust accordingly. - Thyroid Panel With TSH    Elizabeth Sauer, MD

## 2022-11-17 ENCOUNTER — Other Ambulatory Visit: Payer: Self-pay

## 2022-11-17 ENCOUNTER — Encounter: Payer: Self-pay | Admitting: Family Medicine

## 2022-11-17 ENCOUNTER — Other Ambulatory Visit: Payer: Self-pay | Admitting: Family Medicine

## 2022-11-17 LAB — THYROID PANEL WITH TSH
Free Thyroxine Index: 2.4 (ref 1.2–4.9)
T3 Uptake Ratio: 23 % — ABNORMAL LOW (ref 24–39)
T4, Total: 10.3 ug/dL (ref 4.5–12.0)
TSH: 15.6 u[IU]/mL — ABNORMAL HIGH (ref 0.450–4.500)

## 2022-11-17 MED ORDER — LEVOTHYROXINE SODIUM 137 MCG PO CAPS: 137.0000 ug | ORAL_CAPSULE | Freq: Every day | ORAL | 0 refills | Status: DC

## 2022-11-17 NOTE — Telephone Encounter (Signed)
Requested medication (s) are due for refill today: see below  Requested medication (s) are on the active medication list: yes  Last refill:  today  Future visit scheduled: yes  Notes to clinic:    Pharmacy comment: Alternative Requested:PA REQUESTED.       Requested Prescriptions  Pending Prescriptions Disp Refills   Levothyroxine Sodium 137 MCG CAPS [Pharmacy Med Name: LEVOTHYROXINE 137 MCG CAPSULE] 60 capsule 0    Sig: Take 1 capsule (137 mcg total) by mouth daily before breakfast.     Endocrinology:  Hypothyroid Agents Failed - 11/17/2022  5:39 PM      Failed - TSH in normal range and within 360 days    TSH  Date Value Ref Range Status  11/16/2022 15.600 (H) 0.450 - 4.500 uIU/mL Final         Passed - Valid encounter within last 12 months    Recent Outpatient Visits           Yesterday Acquired hypothyroidism   [facility], [facility]   2 weeks ago Acquired hypothyroidism   [facility], MD   6 months ago Acquired hypothyroidism   [facility], [facility]   10 months ago Acquired hypothyroidism   [facility], MD   1 year ago Acquired hypothyroidism   [facility], MD       Future Appointments             In 1 month Duanne Limerick, MD [facility]

## 2022-11-23 ENCOUNTER — Other Ambulatory Visit: Payer: Self-pay

## 2022-11-23 MED ORDER — LEVOTHYROXINE SODIUM 137 MCG PO TABS: 137.0000 ug | ORAL_TABLET | Freq: Every day | ORAL | 0 refills | Status: DC

## 2022-11-23 NOTE — Telephone Encounter (Signed)
Sent in another medication.  KP

## 2022-12-29 ENCOUNTER — Ambulatory Visit: Payer: 59 | Admitting: Family Medicine

## 2023-01-01 ENCOUNTER — Ambulatory Visit: Payer: 59 | Admitting: Family Medicine

## 2023-01-18 ENCOUNTER — Ambulatory Visit (INDEPENDENT_AMBULATORY_CARE_PROVIDER_SITE_OTHER): Payer: 59 | Admitting: Family Medicine

## 2023-01-18 ENCOUNTER — Encounter: Payer: Self-pay | Admitting: Family Medicine

## 2023-01-18 VITALS — BP 122/76 | HR 99 | Ht 62.0 in | Wt 202.0 lb

## 2023-01-18 DIAGNOSIS — E039 Hypothyroidism, unspecified: Secondary | ICD-10-CM | POA: Diagnosis not present

## 2023-01-18 NOTE — Progress Notes (Signed)
Date:  01/18/2023   Name:  Desiree Gaines   DOB:  04/04/1985   MRN:  [id number]   Chief Complaint: Hypothyroidism  Thyroid Problem Presents for follow-up visit. Symptoms include anxiety. Patient reports no cold intolerance, constipation, depressed mood, diaphoresis, diarrhea, dry skin, fatigue, hair loss, heat intolerance, hoarse voice, leg swelling, menstrual problem, nail problem, palpitations, tremors, visual change, weight gain or weight loss. The symptoms have been stable.    Lab Results  Component Value Date   NA 139 05/19/2021   K 4.5 05/19/2021   CO2 20 05/19/2021   GLUCOSE 99 05/19/2021   BUN 11 05/19/2021   CREATININE 1.17 (H) 05/19/2021   CALCIUM 10.5 (H) 05/19/2021   EGFR 63 05/19/2021   GFRNONAA >60 08/07/2020   Lab Results  Component Value Date   CHOL 173 05/19/2021   HDL 49 05/19/2021   LDLCALC 95 05/19/2021   TRIG 171 (H) 05/19/2021   Lab Results  Component Value Date   TSH 15.600 (H) 11/16/2022   No results found for: "HGBA1C" Lab Results  Component Value Date   WBC 5.5 09/11/2022   HGB 11.1 (L) 09/11/2022   HCT 34.2 (L) 09/11/2022   MCV 99.1 09/11/2022   PLT 249 09/11/2022   Lab Results  Component Value Date   ALT 25 08/07/2020   AST 16 08/07/2020   ALKPHOS 112 08/07/2020   BILITOT 0.6 08/07/2020   No results found for: "25OHVITD2", "25OHVITD3", "VD25OH"   Review of Systems  Constitutional:  Negative for chills, diaphoresis, fatigue, fever, weight gain and weight loss.  HENT:  Negative for drooling, ear discharge, ear pain, hoarse voice and sore throat.   Respiratory:  Negative for cough, shortness of breath and wheezing.   Cardiovascular:  Negative for chest pain, palpitations and leg swelling.  Gastrointestinal:  Negative for abdominal pain, blood in stool, constipation, diarrhea and nausea.  Endocrine: Negative for cold intolerance, heat intolerance and polydipsia.  Genitourinary:  Negative for dysuria, frequency, hematuria,  menstrual problem and urgency.  Musculoskeletal:  Negative for back pain, myalgias and neck pain.  Skin:  Negative for rash.  Allergic/Immunologic: Negative for environmental allergies.  Neurological:  Negative for dizziness, tremors and headaches.  Hematological:  Does not bruise/bleed easily.  Psychiatric/Behavioral:  Negative for suicidal ideas. The patient is nervous/anxious.     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   Ventral hernia without obstruction or gangrene 08/12/2020   Facial cellulitis 08/02/2020   Hypothyroidism 08/02/2020   Acute posthemorrhagic anemia 07/09/2011   Delivery with history of cesarean section 07/08/2011   Other postprocedural status(V45.89) 07/08/2011   Iron deficiency anemia 03/06/2011   Maternal thyroid dysfunction, antepartum 01/21/2007   Developmental delay 06/19/2000    No Known Allergies  Past Surgical History:  Procedure Laterality Date   CESAREAN SECTION  06/2011   x 2   COLONOSCOPY WITH PROPOFOL N/A 07/18/2021   Procedure: COLONOSCOPY WITH PROPOFOL;  Surgeon: Wyline Mood, MD;  Location: [facility] END[facility]COPY;  Service: Gastroenterology;  Laterality: N/A;   tubes in ears Bilateral     Social History   Tobacco Use   Smoking status: Never    Passive exposure: Never   Smokeless tobacco: Never  Vaping Use   Vaping status: Never Used  Substance Use Topics  Alcohol use: Not Currently   Drug use: Never     Medication list has been reviewed and updated.  Current Meds  Medication Sig   hydrOXYzine (ATARAX) 25 MG tablet Take 1 tablet (25 mg total) by mouth every 8 (eight) hours as needed for itching (anxiety or insomnia).   levothyroxine (SYNTHROID) 137 MCG tablet Take 1 tablet (137 mcg total) by mouth  daily before breakfast.   Levothyroxine Sodium 137 MCG CAPS Take 1 capsule (137 mcg total) by mouth daily before breakfast.   medroxyPROGESTERone (DEPO-PROVERA) 150 MG/ML injection INJECT 1 ML (150 MG TOTAL) INTO THE MUSCLE EVERY 3 (THREE) MONTHS       01/18/2023    3:38 PM 11/16/2022    3:41 PM 10/31/2022    3:52 PM 04/27/2022    2:11 PM  GAD 7 : Generalized Anxiety Score  Nervous, Anxious, on Edge 2 2 0 0  Control/stop worrying 2 3 0 0  Worry too much - different things 2 2 0 0  Trouble relaxing 1 1 0 0  Restless 0 0 0 0  Easily annoyed or irritable 0 0 0 0  Afraid - awful might happen 0 0 0 0  Total GAD 7 Score 7 8 0 0  Anxiety Difficulty Not difficult at all Not difficult at all Not difficult at all Not difficult at all       01/18/2023    3:38 PM 11/16/2022    3:41 PM 10/31/2022    3:51 PM  Depression screen PHQ 2/9  Decreased Interest 0 2 0  Down, Depressed, Hopeless 0 2 0  PHQ - 2 Score 0 4 0  Altered sleeping 2 3 0  Tired, decreased energy 1 2 0  Change in appetite 0 1 0  Feeling bad or failure about yourself  0 2 0  Trouble concentrating 0 0 0  Moving slowly or fidgety/restless 0 0 0  Suicidal thoughts 0 0 0  PHQ-9 Score 3 12 0  Difficult doing work/chores Not difficult at all Not difficult at all Not difficult at all    BP Readings from Last 3 Encounters:  01/18/23 122/76  11/16/22 (!) 142/78  11/13/22 (!) 126/90    Physical Exam Vitals and nursing note reviewed. Exam conducted with a chaperone present.  Constitutional:      General: She is not in acute distress.    Appearance: She is not diaphoretic.  HENT:     Head: Normocephalic and atraumatic.     Right Ear: Tympanic membrane and external ear normal.     Left Ear: Tympanic membrane and external ear normal.     Nose: Nose normal. No congestion or rhinorrhea.     Mouth/Throat:     Mouth: Mucous membranes are moist.  Eyes:     General:        Right eye: No discharge.        Left eye: No  discharge.     Conjunctiva/sclera: Conjunctivae normal.     Pupils: Pupils are equal, round, and reactive to light.  Neck:     Thyroid: No thyromegaly.     Vascular: No JVD.  Cardiovascular:     Rate and Rhythm: Normal rate and regular rhythm.     Heart sounds: Normal heart sounds. No murmur heard.    No friction rub. No gallop.  Pulmonary:     Effort: Pulmonary effort is normal.     Breath sounds: Normal breath sounds. No wheezing, rhonchi or rales.  Abdominal:  General: Bowel sounds are normal.     Palpations: Abdomen is soft. There is no mass.     Tenderness: There is no abdominal tenderness. There is no guarding.  Musculoskeletal:        General: Normal range of motion.     Cervical back: Normal range of motion and neck supple.  Lymphadenopathy:     Cervical: No cervical adenopathy.  Skin:    General: Skin is warm and dry.  Neurological:     Mental Status: She is alert.     Deep Tendon Reflexes: Reflexes are normal and symmetric.     Wt Readings from Last 3 Encounters:  01/18/23 202 lb (91.6 kg)  11/16/22 202 lb (91.6 kg)  11/13/22 202 lb (91.6 kg)    BP 122/76   Pulse 99   Ht 5\' 2"  (1.575 m)   Wt 202 lb (91.6 kg)   SpO2 99%   BMI 36.95 kg/m   Assessment and Plan: 1. Acquired hypothyroidism (Primary) Chronic.  Controlled.  Stable.  Asymptomatic.  Tolerating current dosing of levothyroxine at 137 mcg well.  Will check TSH to see if this is sufficient.  Pending TSH we will likely refill at this dosing and will recheck patient in 4 months and if maintaining will go to a 69-month.  Recheck. - TSH     Elizabeth Sauer, MD

## 2023-01-19 ENCOUNTER — Other Ambulatory Visit: Payer: Self-pay | Admitting: Family Medicine

## 2023-01-19 LAB — TSH: TSH: 3.47 u[IU]/mL (ref 0.450–4.500)

## 2023-01-19 MED ORDER — LEVOTHYROXINE SODIUM 137 MCG PO TABS: 137.0000 ug | ORAL_TABLET | Freq: Every day | ORAL | 1 refills | Status: DC

## 2023-02-05 ENCOUNTER — Ambulatory Visit: Payer: 59

## 2023-02-05 VITALS — BP 124/84 | HR 120 | Ht 62.0 in | Wt 199.0 lb

## 2023-02-05 DIAGNOSIS — Z3042 Encounter for surveillance of injectable contraceptive: Secondary | ICD-10-CM

## 2023-02-05 MED ORDER — MEDROXYPROGESTERONE ACETATE 150 MG/ML IM SUSY
150.0000 mg | PREFILLED_SYRINGE | Freq: Once | INTRAMUSCULAR | Status: AC
  Administered 2023-02-05: 150 mg via INTRAMUSCULAR

## 2023-02-12 ENCOUNTER — Inpatient Hospital Stay: Payer: 59 | Attending: Internal Medicine

## 2023-02-12 DIAGNOSIS — D509 Iron deficiency anemia, unspecified: Secondary | ICD-10-CM | POA: Insufficient documentation

## 2023-02-12 DIAGNOSIS — D500 Iron deficiency anemia secondary to blood loss (chronic): Secondary | ICD-10-CM

## 2023-02-12 DIAGNOSIS — E538 Deficiency of other specified B group vitamins: Secondary | ICD-10-CM | POA: Insufficient documentation

## 2023-02-12 LAB — CBC WITH DIFFERENTIAL/PLATELET
Abs Immature Granulocytes: 0.06 10*3/uL (ref 0.00–0.07)
Basophils Absolute: 0.1 10*3/uL (ref 0.0–0.1)
Basophils Relative: 1 %
Eosinophils Absolute: 0.1 10*3/uL (ref 0.0–0.5)
Eosinophils Relative: 2 %
HCT: 38.7 % (ref 36.0–46.0)
Hemoglobin: 12.1 g/dL (ref 12.0–15.0)
Immature Granulocytes: 1 %
Lymphocytes Relative: 30 %
Lymphs Abs: 1.6 10*3/uL (ref 0.7–4.0)
MCH: 30 pg (ref 26.0–34.0)
MCHC: 31.3 g/dL (ref 30.0–36.0)
MCV: 95.8 fL (ref 80.0–100.0)
Monocytes Absolute: 0.5 10*3/uL (ref 0.1–1.0)
Monocytes Relative: 9 %
Neutro Abs: 3 10*3/uL (ref 1.7–7.7)
Neutrophils Relative %: 57 %
Platelets: 295 10*3/uL (ref 150–400)
RBC: 4.04 MIL/uL (ref 3.87–5.11)
RDW: 13 % (ref 11.5–15.5)
WBC: 5.3 10*3/uL (ref 4.0–10.5)
nRBC: 0 % (ref 0.0–0.2)

## 2023-02-12 LAB — FERRITIN: Ferritin: 83 ng/mL (ref 11–307)

## 2023-02-12 LAB — IRON AND TIBC
Iron: 91 ug/dL (ref 28–170)
Saturation Ratios: 27 % (ref 10.4–31.8)
TIBC: 336 ug/dL (ref 250–450)
UIBC: 245 ug/dL

## 2023-02-12 LAB — VITAMIN B12: Vitamin B-12: 247 pg/mL (ref 180–914)

## 2023-02-12 LAB — FOLATE: Folate: 4.6 ng/mL — ABNORMAL LOW (ref >5.9)

## 2023-02-15 ENCOUNTER — Encounter: Payer: Self-pay | Admitting: Internal Medicine

## 2023-02-15 ENCOUNTER — Inpatient Hospital Stay (HOSPITAL_BASED_OUTPATIENT_CLINIC_OR_DEPARTMENT_OTHER): Payer: 59 | Admitting: Internal Medicine

## 2023-02-15 VITALS — BP 121/86 | HR 108 | Temp 98.7°F | Resp 20 | Wt 200.0 lb

## 2023-02-15 DIAGNOSIS — E538 Deficiency of other specified B group vitamins: Secondary | ICD-10-CM | POA: Insufficient documentation

## 2023-02-15 DIAGNOSIS — D509 Iron deficiency anemia, unspecified: Secondary | ICD-10-CM | POA: Diagnosis not present

## 2023-02-15 DIAGNOSIS — D500 Iron deficiency anemia secondary to blood loss (chronic): Secondary | ICD-10-CM

## 2023-02-15 NOTE — Progress Notes (Signed)
 Patient has no concerns today.

## 2023-02-15 NOTE — Progress Notes (Signed)
 [facility]  Telephone:(336) [phone] [phone]) [phone]  ID: Desiree Gaines OB: [birth year]  MR#: [id number]  RDW#:[id number]  Patient Care Team: Joshua Cathryne BROCKS, MD as PCP - General (Family Medicine) Ezzard Rolin BIRCH, LCSW as Social Worker (Licensed Clinical Social Worker) Clista Bimler, MD as Consulting Physician (Oncology)  REFERRING PROVIDER: Dr. Joshua  REASON FOR REFERRAL: Iron  deficiency anemia  HPI: Desiree Gaines is a 38 y.o. female with past medical history of Graves' disease was referred to hematology for management of iron  deficiency anemia  Patient has long standing history of anemia.  Most recent blood work from 05/10/2022 showed hemoglobin 8.6, MCV 80, platelets 364 and WBC 7.7.  Ferritin 12.  She could not tolerate oral iron  due to GI upset.  Has history of heavy menstrual period which has significantly improved after she was started on Depo sometime in August 2023.  She had colonoscopy on 07/18/2021 when Dr. Therisa followed-up for volvulus.  Showed 5 mm polyp in ascending colon otherwise unremarkable.  She was scheduled for endoscopy but had to be canceled since she did not have anyone to drive her for the procedure.  Treated with IV iron  last in May 2024.  Interval history Patient was seen today as follow-up for IDA and labs. Patient no longer has menstrual cycles after being started on Depo.  Denies any bleeding in urine or stools.  She reports some shortness of breath on exertion.  Ongoing since November.  Denies any worsening.  I discussed with the patient that it is unlikely to be related to her anemia since her blood counts have been normal.  She was advised to discuss this further with her primary care doctor.  REVIEW OF SYSTEMS:   Review of Systems  Respiratory:  Positive for shortness of breath.     As per HPI. Otherwise, a complete review of systems is negative.  PAST MEDICAL HISTORY: Past Medical History:  Diagnosis Date    Adrenal nodule (HCC)    Anemia    Cancer (HCC)    Graves disease    Hypothyroidism    Pituitary tumor    Thyroid  cancer (HCC)    Thyroid  disease    Umbilical hernia     PAST SURGICAL HISTORY: Past Surgical History:  Procedure Laterality Date   CESAREAN SECTION  06/2011   x 2   COLONOSCOPY WITH PROPOFOL  N/A 07/18/2021   Procedure: COLONOSCOPY WITH PROPOFOL ;  Surgeon: Therisa Bi, MD;  Location: [facility] ENDOSCOPY;  Service: Gastroenterology;  Laterality: N/A;   tubes in ears Bilateral     FAMILY HISTORY: Family History  Problem Relation Age of Onset   Diabetes Mother    Hypertension Mother    Stroke Mother    Prostate cancer Father    Hypertension Sister    Breast cancer Sister    Heart disease Brother     HEALTH MAINTENANCE: Social History   Tobacco Use   Smoking status: Never    Passive exposure: Never   Smokeless tobacco: Never  Vaping Use   Vaping status: Never Used  Substance Use Topics   Alcohol use: Not Currently   Drug use: Never     No Known Allergies  Current Outpatient Medications  Medication Sig Dispense Refill   hydrOXYzine  (ATARAX ) 25 MG tablet Take 1 tablet (25 mg total) by mouth every 8 (eight) hours as needed for itching (anxiety or insomnia). 30 tablet 2   levothyroxine  (SYNTHROID ) 137 MCG tablet Take 1 tablet (137 mcg total) by mouth daily  before breakfast. 90 tablet 1   Levothyroxine  Sodium 137 MCG CAPS Take 1 capsule (137 mcg total) by mouth daily before breakfast. 60 capsule 0   medroxyPROGESTERone  (DEPO-PROVERA ) 150 MG/ML injection INJECT 1 ML (150 MG TOTAL) INTO THE MUSCLE EVERY 3 (THREE) MONTHS 1 mL 0   cyanocobalamin  (VITAMIN B12) 1000 MCG tablet Take 1,000 mcg by mouth daily. Gummy otc (Patient not taking: Reported on 02/15/2023)     folic acid  (FOLVITE ) 1 MG tablet Take 1 tablet (1 mg total) by mouth daily. (Patient not taking: Reported on 02/15/2023) 90 tablet 1   No current facility-administered medications for this visit.     OBJECTIVE: Vitals:   02/15/23 1049  BP: 121/86  Pulse: (!) 108  Resp: 20  Temp: 98.7 F (37.1 C)  SpO2: 100%      Body mass index is 36.58 kg/m.      General: Well-developed, well-nourished, no acute distress. Eyes: Pink conjunctiva, anicteric sclera. HEENT: Normocephalic, moist mucous membranes, clear oropharnyx. Lungs: Clear to auscultation bilaterally. Heart: Regular rate and rhythm. No rubs, murmurs, or gallops. Abdomen: Soft, nontender, nondistended. No organomegaly noted, normoactive bowel sounds. Musculoskeletal: No edema, cyanosis, or clubbing. Neuro: Alert, answering all questions appropriately. Cranial nerves grossly intact. Skin: No rashes or petechiae noted. Psych: Normal affect. Lymphatics: No cervical, calvicular, axillary or inguinal LAD.   LAB RESULTS:  Lab Results  Component Value Date   NA 139 05/19/2021   K 4.5 05/19/2021   CL 105 05/19/2021   CO2 20 05/19/2021   GLUCOSE 99 05/19/2021   BUN 11 05/19/2021   CREATININE 1.17 (H) 05/19/2021   CALCIUM 10.5 (H) 05/19/2021   PROT 6.9 08/07/2020   ALBUMIN 4.9 (H) 05/19/2021   AST 16 08/07/2020   ALT 25 08/07/2020   ALKPHOS 112 08/07/2020   BILITOT 0.6 08/07/2020   GFRNONAA >60 08/07/2020   GFRAA >60 07/17/2011    Lab Results  Component Value Date   WBC 5.3 02/12/2023   NEUTROABS 3.0 02/12/2023   HGB 12.1 02/12/2023   HCT 38.7 02/12/2023   MCV 95.8 02/12/2023   PLT 295 02/12/2023    Lab Results  Component Value Date   TIBC 336 02/12/2023   TIBC 356 09/11/2022   TIBC 363 05/10/2021   FERRITIN 83 02/12/2023   FERRITIN 87 09/11/2022   FERRITIN 12 (L) 05/10/2022   IRONPCTSAT 27 02/12/2023   IRONPCTSAT 26 09/11/2022   IRONPCTSAT 18 05/10/2021     STUDIES: No results found.  ASSESSMENT AND PLAN:   Desiree Gaines is a 38 y.o. female with pmh of Graves' disease was referred to hematology for management of iron  deficiency anemia.  # Iron  deficiency anemia -Could not tolerate  oral iron  due to GI upset.  Completed IV Venofer  200 mg weekly x 5 doses in May 2024. -colonoscopy done in June 2023 by Dr. Therisa for volvulus which showed 5 mm polyp in the ascending colon otherwise was unremarkable.  She was scheduled for endoscopy but then canceled it because she did not have anyone to drive her for the procedure. -No long having menstrual bleeding since on Depo (August 2023) -Labs reviewed today.  Hemoglobin normal at 12.1.  Iron  panel is normal.  No indication for IV iron  infusion.  # Folic acid  deficiency -Folic acid  4.0.  Reports folic acid  1 mg prescription was not covered.  I advised her to get folic acid  over-the-counter 400 mcg once daily.  # Borderline low vitamin B12 -B12 level 276.  Advised to take  B12 1000 mcg Gummies every day.   Orders Placed This Encounter  Procedures   CBC (Cancer Center Only)   Ferritin   Iron  and TIBC   RTC in 8 months for MD visit, labs  Patient expressed understanding and was in agreement with this plan. She also understands that She can call clinic at any time with any questions, concerns, or complaints.   I spent a total of 25 minutes reviewing chart data, face-to-face evaluation with the patient, counseling and coordination of care as detailed above.  Genevia Rous, MD   02/15/2023 12:29 PM

## 2023-02-15 NOTE — Patient Instructions (Signed)
 Please take Vitamin b12 1000 mcg once daily. Available over the counter.   Folic acid 400 mcg once daily. Available over the counter.

## 2023-03-12 ENCOUNTER — Encounter: Payer: Self-pay | Admitting: Internal Medicine

## 2023-04-11 ENCOUNTER — Encounter: Payer: Self-pay | Admitting: Internal Medicine

## 2023-04-30 ENCOUNTER — Ambulatory Visit (INDEPENDENT_AMBULATORY_CARE_PROVIDER_SITE_OTHER): Payer: 59

## 2023-04-30 DIAGNOSIS — Z3042 Encounter for surveillance of injectable contraceptive: Secondary | ICD-10-CM | POA: Diagnosis not present

## 2023-04-30 MED ORDER — MEDROXYPROGESTERONE ACETATE 150 MG/ML IM SUSY
150.0000 mg | PREFILLED_SYRINGE | Freq: Once | INTRAMUSCULAR | Status: AC
Start: 2023-04-30 — End: 2023-04-30
  Administered 2023-04-30: 150 mg via INTRAMUSCULAR

## 2023-04-30 NOTE — Progress Notes (Signed)
    NURSE VISIT NOTE  Subjective:    Patient ID: Desiree Gaines, female    DOB: Nov 03, 1985, 38 y.o.   MRN: [id number]  HPI  Patient is a 38 y.o. G71P1002 female who presents for depo provera injection.   Objective:    There were no vitals taken for this visit.  Last Annual: 08/31/2022. Last pap: 09/19/2021. Last Depo-Provera: 02/05/2023. Side Effects if any: none. Serum HCG indicated? No . Depo-Provera 150 mg IM given by: Doristine Devoid, CMA. Site: Right Deltoid  Lab Review  @THIS  VISIT ONLY@  Assessment:   1. Encounter for Depo-Provera contraception      Plan:   Next appointment due between June 9th  and June 23rd .    Burtis Junes, CMA

## 2023-05-15 ENCOUNTER — Encounter: Payer: Self-pay | Admitting: Internal Medicine

## 2023-05-21 ENCOUNTER — Encounter: Payer: Self-pay | Admitting: Family Medicine

## 2023-05-21 ENCOUNTER — Ambulatory Visit (INDEPENDENT_AMBULATORY_CARE_PROVIDER_SITE_OTHER): Payer: Self-pay | Admitting: Family Medicine

## 2023-05-21 VITALS — BP 122/78 | HR 102 | Resp 16 | Ht 74.0 in | Wt 203.2 lb

## 2023-05-21 DIAGNOSIS — E039 Hypothyroidism, unspecified: Secondary | ICD-10-CM | POA: Diagnosis not present

## 2023-05-21 NOTE — Progress Notes (Signed)
 Date:  05/21/2023   Name:  Desiree Gaines   DOB:  01/22/1986   MRN:  [id number]   Chief Complaint: Hypothyroidism  Thyroid Problem Presents for follow-up visit. Symptoms include anxiety, cold intolerance, heat intolerance and weight gain. Patient reports no constipation, depressed mood, diaphoresis, diarrhea, dry skin, fatigue, hair loss, hoarse voice, leg swelling, menstrual problem, nail problem, palpitations, tremors, visual change or weight loss.    Lab Results  Component Value Date   NA 139 05/19/2021   K 4.5 05/19/2021   CO2 20 05/19/2021   GLUCOSE 99 05/19/2021   BUN 11 05/19/2021   CREATININE 1.17 (H) 05/19/2021   CALCIUM 10.5 (H) 05/19/2021   EGFR 63 05/19/2021   GFRNONAA >60 08/07/2020   Lab Results  Component Value Date   CHOL 173 05/19/2021   HDL 49 05/19/2021   LDLCALC 95 05/19/2021   TRIG 171 (H) 05/19/2021   Lab Results  Component Value Date   TSH 3.470 01/18/2023   No results found for: "HGBA1C" Lab Results  Component Value Date   WBC 5.3 02/12/2023   HGB 12.1 02/12/2023   HCT 38.7 02/12/2023   MCV 95.8 02/12/2023   PLT 295 02/12/2023   Lab Results  Component Value Date   ALT 25 08/07/2020   AST 16 08/07/2020   ALKPHOS 112 08/07/2020   BILITOT 0.6 08/07/2020   No results found for: "25OHVITD2", "25OHVITD3", "VD25OH"   Review of Systems  Constitutional:  Positive for weight gain. Negative for diaphoresis, fatigue, unexpected weight change and weight loss.  HENT:  Negative for hoarse voice.   Respiratory:  Negative for chest tightness.   Cardiovascular:  Negative for chest pain, palpitations and leg swelling.  Gastrointestinal:  Negative for blood in stool, constipation and diarrhea.  Endocrine: Positive for cold intolerance and heat intolerance.  Genitourinary:  Negative for difficulty urinating and menstrual problem.  Neurological:  Negative for tremors.  Psychiatric/Behavioral:  The patient is nervous/anxious.     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   Ventral hernia without obstruction or gangrene 08/12/2020   Facial cellulitis 08/02/2020   Hypothyroidism 08/02/2020   Acute posthemorrhagic anemia 07/09/2011   Delivery with history of cesarean section 07/08/2011   Other postprocedural status(V45.89) 07/08/2011   Iron deficiency anemia 03/06/2011   Maternal thyroid dysfunction, antepartum 01/21/2007   Developmental delay 06/19/2000    No Known Allergies  Past Surgical History:  Procedure Laterality Date   CESAREAN SECTION  06/2011   x 2   COLONOSCOPY WITH PROPOFOL N/A 07/18/2021   Procedure: COLONOSCOPY WITH PROPOFOL;  Surgeon: Wyline Mood, MD;  Location[facility] ENDOSCOPY;  Service: Gastroenterology;  Laterality: N/A;   tubes in ears Bilateral     Social History   Tobacco Use   Smoking status: Never    Passive exposure: Never   Smokeless tobacco: Never  Vaping Use   Vaping status: Never Used  Substance Use Topics   Alcohol use: Not Currently   Drug use: Never     Medication list has been reviewed and updated.  Current Meds  Medication Sig   cyanocobalamin (VITAMIN B12) 1000 MCG tablet Take 1,000 mcg by mouth daily. Gummy otc   folic acid (FOLVITE) 1 MG tablet Take 1  tablet (1 mg total) by mouth daily.   hydrOXYzine (ATARAX) 25 MG tablet Take 1 tablet (25 mg total) by mouth every 8 (eight) hours as needed for itching (anxiety or insomnia).   levothyroxine (SYNTHROID) 137 MCG tablet Take 1 tablet (137 mcg total) by mouth daily before breakfast.   Levothyroxine Sodium 137 MCG CAPS Take 1 capsule (137 mcg total) by mouth daily before breakfast.   medroxyPROGESTERone (DEPO-PROVERA) 150 MG/ML injection INJECT 1 ML (150 MG TOTAL) INTO  THE MUSCLE EVERY 3 (THREE) MONTHS       01/18/2023    3:38 PM 11/16/2022    3:41 PM 10/31/2022    3:52 PM 04/27/2022    2:11 PM  GAD 7 : Generalized Anxiety Score  Nervous, Anxious, on Edge 2 2 0 0  Control/stop worrying 2 3 0 0  Worry too much - different things 2 2 0 0  Trouble relaxing 1 1 0 0  Restless 0 0 0 0  Easily annoyed or irritable 0 0 0 0  Afraid - awful might happen 0 0 0 0  Total GAD 7 Score 7 8 0 0  Anxiety Difficulty Not difficult at all Not difficult at all Not difficult at all Not difficult at all       01/18/2023    3:38 PM 11/16/2022    3:41 PM 10/31/2022    3:51 PM  Depression screen PHQ 2/9  Decreased Interest 0 2 0  Down, Depressed, Hopeless 0 2 0  PHQ - 2 Score 0 4 0  Altered sleeping 2 3 0  Tired, decreased energy 1 2 0  Change in appetite 0 1 0  Feeling bad or failure about yourself  0 2 0  Trouble concentrating 0 0 0  Moving slowly or fidgety/restless 0 0 0  Suicidal thoughts 0 0 0  PHQ-9 Score 3 12 0  Difficult doing work/chores Not difficult at all Not difficult at all Not difficult at all    BP Readings from Last 3 Encounters:  05/21/23 122/78  02/15/23 121/86  02/05/23 124/84    Physical Exam Vitals and nursing note reviewed.  Constitutional:      General: She is not in acute distress.    Appearance: She is not diaphoretic.  HENT:     Head: Normocephalic and atraumatic.     Right Ear: External ear normal.     Left Ear: External ear normal.     Nose: Nose normal.  Eyes:     General:        Right eye: No discharge.        Left eye: No discharge.     Conjunctiva/sclera: Conjunctivae normal.     Pupils: Pupils are equal, round, and reactive to light.  Neck:     Thyroid: No thyromegaly.     Vascular: No JVD.  Cardiovascular:     Rate and Rhythm: Normal rate and regular rhythm.     Heart sounds: Normal heart sounds. No murmur heard.    No friction rub. No gallop.  Pulmonary:     Effort: Pulmonary effort is normal.      Breath sounds: Normal breath sounds.  Abdominal:     General: Bowel sounds are normal.     Palpations: Abdomen is soft. There is no mass.     Tenderness: There is no abdominal tenderness. There is no guarding.  Musculoskeletal:        General: Normal range of motion.     Cervical back: Normal  range of motion and neck supple.  Lymphadenopathy:     Cervical: No cervical adenopathy.  Skin:    General: Skin is warm and dry.  Neurological:     Mental Status: She is alert.     Deep Tendon Reflexes: Reflexes are normal and symmetric.     Wt Readings from Last 3 Encounters:  05/21/23 203 lb 3.2 oz (92.2 kg)  02/15/23 200 lb (90.7 kg)  02/05/23 199 lb (90.3 kg)    BP 122/78   Pulse (!) 102   Resp 16   Ht 6\' 2"  (1.88 m)   Wt 203 lb 3.2 oz (92.2 kg)   SpO2 100%   BMI 26.09 kg/m   Assessment and Plan: 1. Acquired hypothyroidism (Primary) Chronic.  Controlled.  Stable.  Patient recently has had an increase from 125 mcg to 137 mcg.  TSH was noted to be in acceptable range and we will continue with current dosing and will check TSH and T4 to see if this is sufficient. - TSH + free T4     Alayne Allis, MD

## 2023-05-22 ENCOUNTER — Encounter: Payer: Self-pay | Admitting: Family Medicine

## 2023-05-22 LAB — TSH+FREE T4
Free T4: 1.39 ng/dL (ref 0.82–1.77)
TSH: 2.46 u[IU]/mL (ref 0.450–4.500)

## 2023-07-23 ENCOUNTER — Ambulatory Visit (INDEPENDENT_AMBULATORY_CARE_PROVIDER_SITE_OTHER)

## 2023-07-23 VITALS — BP 138/87 | HR 103 | Ht 62.0 in | Wt 206.0 lb

## 2023-07-23 DIAGNOSIS — Z3042 Encounter for surveillance of injectable contraceptive: Secondary | ICD-10-CM | POA: Diagnosis not present

## 2023-07-23 MED ORDER — MEDROXYPROGESTERONE ACETATE 150 MG/ML IM SUSY
150.0000 mg | PREFILLED_SYRINGE | Freq: Once | INTRAMUSCULAR | Status: AC
  Administered 2023-07-23: 150 mg via INTRAMUSCULAR

## 2023-07-23 NOTE — Progress Notes (Signed)
    NURSE VISIT NOTE  Subjective:    Patient ID: Desiree Gaines, female    DOB: 05-18-1985, 38 y.o.   MRN: [id number]  HPI  Patient is a 38 y.o. G27P1002 female who presents for depo provera  injection.   Objective:    BP 138/87   Pulse (!) 103   Ht 5' 2 (1.575 m)   Wt 206 lb (93.4 kg)   BMI 37.68 kg/m   Last Annual: 08/31/2022. Aaron Aas Last pap: 09/19/21. Last Depo-Provera : 04/30/23. Side Effects if any: none. Serum HCG indicated? No . Depo-Provera  150 mg IM given by: Ila Malay, CMA. Site: Right Ventrogluteal   Lab Review  No results found for any visits on 07/23/23.  Assessment:   1. Encounter for Depo-Provera  contraception      Plan:   Next appointment due between sept 1-sept 15     Abram Abraham, CMA

## 2023-08-01 ENCOUNTER — Ambulatory Visit (INDEPENDENT_AMBULATORY_CARE_PROVIDER_SITE_OTHER): Admitting: Emergency Medicine

## 2023-08-01 VITALS — Ht 62.0 in | Wt 205.0 lb

## 2023-08-01 DIAGNOSIS — Z0000 Encounter for general adult medical examination without abnormal findings: Secondary | ICD-10-CM

## 2023-08-01 NOTE — Patient Instructions (Signed)
 Desiree Gaines , Thank you for taking time out of your busy schedule to complete your Annual Wellness Visit with me. I enjoyed our conversation and look forward to speaking with you again next year. I, as well as your care team,  appreciate your ongoing commitment to your health goals. Please review the following plan we discussed and let me know if I can assist you in the future. Your Game plan/ To Do List    Referrals: None  Follow up Visits: Next Medicare AWV with our clinical staff: 08/06/24 @ 10:10am (PHONE VISIT)   Have you seen your provider in the last 6 months (3 months if uncontrolled diabetes)? Yes Next Office Visit with your provider: 11/20/23 @ 9:20 with Dr. Harlene Saddler  Clinician Recommendations: Get a routine eye exam at your earliest convenience. I have included a list of eye doctors in the area. Aim for 30 minutes of exercise or brisk walking, 6-8 glasses of water, and 5 servings of fruits and vegetables each day.       This is a list of the screening recommended for you and due dates:  Health Maintenance  Topic Date Due   Hepatitis B Vaccine (2 of 3 - 3-dose series) 08/17/2005   Flu Shot  09/07/2023   Medicare Annual Wellness Visit  07/31/2024   Pap with HPV screening  09/20/2026   HPV Vaccine  Completed   Hepatitis C Screening  Completed   HIV Screening  Completed   Meningitis B Vaccine  Aged Out   DTaP/Tdap/Td vaccine  Discontinued   Colon Cancer Screening  Discontinued   COVID-19 Vaccine  Discontinued    Advanced directives: (Copy Requested) Please bring a copy of your health care power of attorney and living will to the office to be added to your chart at your convenience. You can mail to [facility] [address] [place], [place] [address] or email to [email or web address] Advance Care Planning is important because it:  [x]  Makes sure you receive the medical care that is consistent with your values, goals, and preferences  [x]  It provides  guidance to your family and loved ones and reduces their decisional burden about whether or not they are making the right decisions based on your wishes.  Follow the link provided in your after visit summary or read over the paperwork we have mailed to you to help you started getting your Advance Directives in place. If you need assistance in completing these, please reach out to us  so that we can help you!  See attachments for Preventive Care and Fall Prevention Tips.   There are several Eye Doctors in your area. Here are a few that usually accept all insurance types:  [facility] [address] [place], [place] [address] Phone: [phone]  [facility] [address] [place], [place] [address] Phone: [phone]  [facility] [address] [place], [place] [address] Phone: [phone]  [facility] [address] [place], [place] [address] Phone: [phone]  [facility] [address] [place], [place] [address] Phone: [phone]  [facility] [address] [place], [place] [address] Phone: [phone]  Please let us  know if you require a referral for an eye exam appointment. Thank you!   Fall Prevention in the Home, Adult Falls can cause injuries and affect people of all ages. There are many simple things that you can do to make your home safe and to help prevent falls. If you  need it, ask for help making these changes. What actions can I take to prevent falls? General information Use good lighting in all rooms. Make sure to: Replace any light bulbs that burn out. Turn on lights if it is dark and use night-lights. Keep items that you use often in easy-to-reach places. Lower the shelves around your home if needed. Move furniture so that there are clear paths around it. Do not keep throw rugs or other things on the floor that can make you trip. If any of your floors are uneven, fix them. Add color or contrast paint or tape to clearly mark and  help you see: Grab bars or handrails. First and last steps of staircases. Where the edge of each step is. If you use a ladder or stepladder: Make sure that it is fully opened. Do not climb a closed ladder. Make sure the sides of the ladder are locked in place. Have someone hold the ladder while you use it. Know where your pets are as you move through your home. What can I do in the bathroom?     Keep the floor dry. Clean up any water that is on the floor right away. Remove soap buildup in the bathtub or shower. Buildup makes bathtubs and showers slippery. Use non-skid mats or decals on the floor of the bathtub or shower. Attach bath mats securely with double-sided, non-slip rug tape. If you need to sit down while you are in the shower, use a non-slip stool. Install grab bars by the toilet and in the bathtub and shower. Do not use towel bars as grab bars. What can I do in the bedroom? Make sure that you have a light by your bed that is easy to reach. Do not use any sheets or blankets on your bed that hang to the floor. Have a firm bench or chair with side arms that you can use for support when you get dressed. What can I do in the kitchen? Clean up any spills right away. If you need to reach something above you, use a sturdy step stool that has a grab bar. Keep electrical cables out of the way. Do not use floor polish or wax that makes floors slippery. What can I do with my stairs? Do not leave anything on the stairs. Make sure that you have a light switch at the top and the bottom of the stairs. Have them installed if you do not have them. Make sure that there are handrails on both sides of the stairs. Fix handrails that are broken or loose. Make sure that handrails are as long as the staircases. Install non-slip stair treads on all stairs in your home if they do not have carpet. Avoid having throw rugs at the top or bottom of stairs, or secure the rugs with carpet tape to prevent  them from moving. Choose a carpet design that does not hide the edge of steps on the stairs. Make sure that carpet is firmly attached to the stairs. Fix any carpet that is loose or worn. What can I do on the outside of my home? Use bright outdoor lighting. Repair the edges of walkways and driveways and fix any cracks. Clear paths of anything that can make you trip, such as tools or rocks. Add color or contrast paint or tape to clearly mark and help you see high doorway thresholds. Trim any bushes or trees on the main path into your home. Check that handrails are securely fastened and in  good repair. Both sides of all steps should have handrails. Install guardrails along the edges of any raised decks or porches. Have leaves, snow, and ice cleared regularly. Use sand, salt, or ice melt on walkways during winter months if you live where there is ice and snow. In the garage, clean up any spills right away, including grease or oil spills. What other actions can I take? Review your medicines with your health care provider. Some medicines can make you confused or feel dizzy. This can increase your chance of falling. Wear closed-toe shoes that fit well and support your feet. Wear shoes that have rubber soles and low heels. Use a cane, walker, scooter, or crutches that help you move around if needed. Talk with your provider about other ways that you can decrease your risk of falls. This may include seeing a physical therapist to learn to do exercises to improve movement and strength. Where to find more information Centers for Disease Control and Prevention, [employer]: TonerPromos.no [employer] on Aging: BaseRingTones.pl National Institute on Aging: BaseRingTones.pl Contact a health care provider if: You are afraid of falling at home. You feel weak, drowsy, or dizzy at home. You fall at home. Get help right away if you: Lose consciousness or have trouble moving after a fall. Have a fall that causes a head  injury. These symptoms may be an emergency. Get help right away. Call 911. Do not wait to see if the symptoms will go away. Do not drive yourself to the hospital. This information is not intended to replace advice given to you by your health care provider. Make sure you discuss any questions you have with your health care provider. Document Revised: 09/26/2021 Document Reviewed: 09/26/2021 Elsevier Patient Education  2024 [employer].

## 2023-08-01 NOTE — Progress Notes (Signed)
 Subjective:   Desiree Gaines is a 38 y.o. who presents for a Medicare Wellness preventive visit.  As a reminder, Annual Wellness Visits don't include a physical exam, and some assessments may be limited, especially if this visit is performed virtually. We may recommend an in-person follow-up visit with your provider if needed.  Visit Complete: Virtual I connected with  Courtnei L Barro on 08/01/23 by a audio enabled telemedicine application and verified that I am speaking with the correct person using two identifiers.  Patient Location: Home  Provider Location: Home Office  I discussed the limitations of evaluation and management by telemedicine. The patient expressed understanding and agreed to proceed.  Vital Signs: Because this visit was a virtual/telehealth visit, some criteria may be missing or patient reported. Any vitals not documented were not able to be obtained and vitals that have been documented are patient reported.  VideoDeclined- This patient declined Librarian, academic. Therefore the visit was completed with audio only.  Persons Participating in Visit: Patient.  AWV Questionnaire: No: Patient Medicare AWV questionnaire was not completed prior to this visit.  Cardiac Risk Factors include: obesity (BMI >30kg/m2);sedentary lifestyle     Objective:    Today's Vitals   08/01/23 1003  Weight: 205 lb (93 kg)  Height: 5' 2 (1.575 m)   Body mass index is 37.49 kg/m.     08/01/2023   10:13 AM 02/15/2023   10:49 AM 09/12/2022    1:39 PM 06/07/2022    3:00 PM 05/17/2022    3:00 PM 05/15/2022    2:11 PM 04/27/2022    9:15 AM  Advanced Directives  Does Patient Have a Medical Advance Directive? Yes No No No No No No  Type of Advance Directive Healthcare Power of Attorney        Does patient want to make changes to medical advance directive? No - Patient declined        Copy of Healthcare Power of Attorney in Chart? No - copy requested         Would patient like information on creating a medical advance directive?  No - Patient declined  No - Patient declined No - Patient declined  No - Patient declined    Current Medications (verified) Outpatient Encounter Medications as of 08/01/2023  Medication Sig   hydrOXYzine  (ATARAX ) 25 MG tablet Take 1 tablet (25 mg total) by mouth every 8 (eight) hours as needed for itching (anxiety or insomnia).   levothyroxine  (SYNTHROID ) 137 MCG tablet Take 1 tablet (137 mcg total) by mouth daily before breakfast.   medroxyPROGESTERone  (DEPO-PROVERA ) 150 MG/ML injection INJECT 1 ML (150 MG TOTAL) INTO THE MUSCLE EVERY 3 (THREE) MONTHS   cyanocobalamin  (VITAMIN B12) 1000 MCG tablet Take 1,000 mcg by mouth daily. Gummy otc (Patient not taking: Reported on 08/01/2023)   folic acid  (FOLVITE ) 1 MG tablet Take 1 tablet (1 mg total) by mouth daily. (Patient not taking: Reported on 08/01/2023)   Levothyroxine  Sodium 137 MCG CAPS Take 1 capsule (137 mcg total) by mouth daily before breakfast. (Patient not taking: Reported on 08/01/2023)   No facility-administered encounter medications on file as of 08/01/2023.    Allergies (verified) Patient has no known allergies.   History: Past Medical History:  Diagnosis Date   Adrenal nodule (HCC)    Anemia    Cancer (HCC)    Graves disease    Hypothyroidism    Pituitary tumor    Thyroid  cancer (HCC)    Thyroid  disease  Umbilical hernia    Past Surgical History:  Procedure Laterality Date   CESAREAN SECTION  06/2011   x 2   COLONOSCOPY WITH PROPOFOL  N/A 07/18/2021   Procedure: COLONOSCOPY WITH PROPOFOL ;  Surgeon: Therisa Bi, MD;  Location: [facility] ENDOSCOPY;  Service: Gastroenterology;  Laterality: N/A;   tubes in ears Bilateral    Family History  Problem Relation Age of Onset   Diabetes Mother    Hypertension Mother    Stroke Mother    Prostate cancer Father    Hypertension Sister    Breast cancer Sister    Heart disease Brother    Social History    Socioeconomic History   Marital status: Single    Spouse name: Not on file   Number of children: 2   Years of education: Not on file   Highest education level: High school graduate  Occupational History   Occupation: disability  Tobacco Use   Smoking status: Never    Passive exposure: Never   Smokeless tobacco: Never  Vaping Use   Vaping status: Never Used  Substance and Sexual Activity   Alcohol use: Not Currently   Drug use: Never   Sexual activity: Not Currently    Birth control/protection: None, Injection  Other Topics Concern   Not on file  Social History Narrative   Pt has 2 children that live with her   Social Drivers of Corporate investment banker Strain: Low Risk  (08/01/2023)   Overall Financial Resource Strain (CARDIA)    Difficulty of Paying Living Expenses: Not hard at all  Food Insecurity: No Food Insecurity (08/01/2023)   Hunger Vital Sign    Worried About Running Out of Food in the Last Year: Never true    Ran Out of Food in the Last Year: Never true  Transportation Needs: No Transportation Needs (08/01/2023)   PRAPARE - Administrator, Civil Service (Medical): No    Lack of Transportation (Non-Medical): No  Physical Activity: Insufficiently Active (08/01/2023)   Exercise Vital Sign    Days of Exercise per Week: 1 day    Minutes of Exercise per Session: 20 min  Stress: No Stress Concern Present (08/01/2023)   [employer] of Occupational Health - Occupational Stress Questionnaire    Feeling of Stress: Not at all  Social Connections: Moderately Isolated (08/01/2023)   Social Connection and Isolation Panel    Frequency of Communication with Friends and Family: More than three times a week    Frequency of Social Gatherings with Friends and Family: More than three times a week    Attends Religious Services: More than 4 times per year    Active Member of [employer] or Organizations: No    Attends Engineer, structural: Never    Marital  Status: Never married    Tobacco Counseling Counseling given: Not Answered    Clinical Intake:  Pre-visit preparation completed: Yes  Pain : No/denies pain     BMI - recorded: 37.49 Nutritional Status: BMI > 30  Obese Nutritional Risks: None Diabetes: No  No results found for: HGBA1C   How often do you need to have someone help you when you read instructions, pamphlets, or other written materials from your doctor or pharmacy?: 3 - Sometimes What is the last grade level you completed in school?: 12th  Interpreter Needed?: No  Information entered by :: Vina Ned, CMA   Activities of Daily Living     08/01/2023   10:05 AM  In  your present state of health, do you have any difficulty performing the following activities:  Hearing? 0  Vision? 0  Difficulty concentrating or making decisions? 1  Comment sometimes  Walking or climbing stairs? 0  Dressing or bathing? 0  Doing errands, shopping? 0  Preparing Food and eating ? N  Using the Toilet? N  In the past six months, have you accidently leaked urine? N  Do you have problems with loss of bowel control? N  Managing your Medications? N  Managing your Finances? N  Housekeeping or managing your Housekeeping? N    Patient Care Team: Joshua Cathryne BROCKS, MD (Inactive) as PCP - General (Family Medicine) Ezzard Rolin BIRCH, LCSW as Social Worker (Licensed Clinical Social Worker) [name], [name], MD as Consulting Physician (Oncology)  I have updated your Care Teams any recent Medical Services you may have received from other providers in the past year.     Assessment:   This is a routine wellness examination for [facility].  Hearing/Vision screen Hearing Screening - Comments:: Denies hearing loss Vision Screening - Comments:: Needs routine eye exam, [facility]   Goals Addressed             This Visit's Progress    Patient Stated       Be more independent       Depression Screen      08/01/2023   10:10 AM 05/21/2023   11:04 AM 01/18/2023    3:38 PM 11/16/2022    3:41 PM 10/31/2022    3:51 PM 05/15/2022    2:35 PM 04/27/2022    2:10 PM  PHQ 2/9 Scores  PHQ - 2 Score 0 0 0 4 0 4 0  PHQ- 9 Score 3 5 3 12  0  0    Fall Risk     08/01/2023   10:15 AM 01/18/2023    3:38 PM 11/16/2022    3:41 PM 10/31/2022    3:51 PM 04/27/2022    2:10 PM  Fall Risk   Falls in the past year? 0 0 0 0 0  Number falls in past yr: 0 0 0 0 0  Injury with Fall? 0 0 0 0 0  Risk for fall due to : No Fall Risks No Fall Risks No Fall Risks No Fall Risks History of fall(s)  Follow up Falls evaluation completed Falls evaluation completed Falls evaluation completed Falls evaluation completed Falls prevention discussed    MEDICARE RISK AT HOME:  Medicare Risk at Home Any stairs in or around the home?: No If so, are there any without handrails?: No Home free of loose throw rugs in walkways, pet beds, electrical cords, etc?: Yes Adequate lighting in your home to reduce risk of falls?: Yes Life alert?: No Use of a cane, walker or w/c?: No Grab bars in the bathroom?: Yes Shower chair or bench in shower?: No Elevated toilet seat or a handicapped toilet?: No  TIMED UP AND GO:  Was the test performed?  No  Cognitive Function: 6CIT completed        08/01/2023   10:16 AM 04/27/2022    9:18 AM  6CIT Screen  What Year? 0 points 0 points  What month? 0 points 0 points  What time? 0 points 0 points  Count back from 20 0 points 0 points  Months in reverse 4 points 4 points  Repeat phrase 2 points 2 points  Total Score 6 points 6 points    Immunizations Immunization History  Administered Date(s) Administered   DTaP 03/13/1989, 10/29/1992   HIB (PRP-OMP) 07/18/1988   HPV Quadrivalent 07/20/2005, 10/25/2005, 02/20/2006   Hepatitis A, Adult 02/20/2006   Hepatitis B, ADULT 07/20/2005   IPV 10/29/1992   Influenza, Seasonal, Injecte, Preservative Fre 02/06/2011   MMR 07/18/1988, 10/29/1992    Meningococcal Conjugate 07/20/2005   Tdap 07/20/2005, 05/01/2011   Varicella 02/20/2006    Screening Tests Health Maintenance  Topic Date Due   Hepatitis B Vaccines (2 of 3 - 3-dose series) 08/17/2005   INFLUENZA VACCINE  09/07/2023   Medicare Annual Wellness (AWV)  07/31/2024   Cervical Cancer Screening (HPV/Pap Cotest)  09/20/2026   HPV VACCINES  Completed   Hepatitis C Screening  Completed   HIV Screening  Completed   Meningococcal B Vaccine  Aged Out   DTaP/Tdap/Td  Discontinued   Colonoscopy  Discontinued   COVID-19 Vaccine  Discontinued    Health Maintenance  Health Maintenance Due  Topic Date Due   Hepatitis B Vaccines (2 of 3 - 3-dose series) 08/17/2005   Health Maintenance Items Addressed: See Nurse Notes at the end of this note  Additional Screening:  Vision Screening: Recommended annual ophthalmology exams for early detection of glaucoma and other disorders of the eye. Would you like a referral to an eye doctor? No    Dental Screening: Recommended annual dental exams for proper oral hygiene  Community Resource Referral / Chronic Care Management: CRR required this visit?  No   CCM required this visit?  No   Plan:    I have personally reviewed and noted the following in the patient's chart:   Medical and social history Use of alcohol, tobacco or illicit drugs  Current medications and supplements including opioid prescriptions. Patient is not currently taking opioid prescriptions. Functional ability and status Nutritional status Physical activity Advanced directives List of other physicians Hospitalizations, surgeries, and ER visits in previous 12 months Vitals Screenings to include cognitive, depression, and falls Referrals and appointments  In addition, I have reviewed and discussed with patient certain preventive protocols, quality metrics, and best practice recommendations. A written personalized care plan for preventive services as well as  general preventive health recommendations were provided to patient.   Vina Ned, CMA   08/01/2023   After Visit Summary: (Mail) Due to this being a telephonic visit, the after visit summary with patients personalized plan was offered to patient via mail   Notes:  6 CIT Score - 6 Needs Tdap vaccine (pharmacy) Recommended getting a routine eye exam, included a list of eye doctors in AVS

## 2023-10-12 ENCOUNTER — Other Ambulatory Visit: Payer: Self-pay | Admitting: *Deleted

## 2023-10-12 DIAGNOSIS — D500 Iron deficiency anemia secondary to blood loss (chronic): Secondary | ICD-10-CM

## 2023-10-15 ENCOUNTER — Ambulatory Visit

## 2023-10-15 ENCOUNTER — Inpatient Hospital Stay: Payer: 59 | Attending: Oncology

## 2023-10-15 DIAGNOSIS — Z862 Personal history of diseases of the blood and blood-forming organs and certain disorders involving the immune mechanism: Secondary | ICD-10-CM | POA: Diagnosis not present

## 2023-10-15 DIAGNOSIS — E538 Deficiency of other specified B group vitamins: Secondary | ICD-10-CM | POA: Insufficient documentation

## 2023-10-15 DIAGNOSIS — Z79899 Other long term (current) drug therapy: Secondary | ICD-10-CM | POA: Insufficient documentation

## 2023-10-15 DIAGNOSIS — D500 Iron deficiency anemia secondary to blood loss (chronic): Secondary | ICD-10-CM

## 2023-10-15 LAB — CBC WITH DIFFERENTIAL/PLATELET
Abs Immature Granulocytes: 0.12 K/uL — ABNORMAL HIGH (ref 0.00–0.07)
Basophils Absolute: 0.1 K/uL (ref 0.0–0.1)
Basophils Relative: 1 %
Eosinophils Absolute: 0.2 K/uL (ref 0.0–0.5)
Eosinophils Relative: 3 %
HCT: 36.8 % (ref 36.0–46.0)
Hemoglobin: 11.7 g/dL — ABNORMAL LOW (ref 12.0–15.0)
Immature Granulocytes: 2 %
Lymphocytes Relative: 25 %
Lymphs Abs: 1.9 K/uL (ref 0.7–4.0)
MCH: 31.2 pg (ref 26.0–34.0)
MCHC: 31.8 g/dL (ref 30.0–36.0)
MCV: 98.1 fL (ref 80.0–100.0)
Monocytes Absolute: 0.6 K/uL (ref 0.1–1.0)
Monocytes Relative: 7 %
Neutro Abs: 4.8 K/uL (ref 1.7–7.7)
Neutrophils Relative %: 62 %
Platelets: 275 K/uL (ref 150–400)
RBC: 3.75 MIL/uL — ABNORMAL LOW (ref 3.87–5.11)
RDW: 14.3 % (ref 11.5–15.5)
WBC: 7.7 K/uL (ref 4.0–10.5)
nRBC: 0 % (ref 0.0–0.2)

## 2023-10-15 LAB — FERRITIN: Ferritin: 60 ng/mL (ref 11–307)

## 2023-10-15 LAB — IRON AND TIBC
Iron: 73 ug/dL (ref 28–170)
Saturation Ratios: 21 % (ref 10.4–31.8)
TIBC: 350 ug/dL (ref 250–450)
UIBC: 277 ug/dL

## 2023-10-15 LAB — VITAMIN B12: Vitamin B-12: 207 pg/mL (ref 180–914)

## 2023-10-15 LAB — FOLATE: Folate: 3.5 ng/mL — ABNORMAL LOW (ref >5.9)

## 2023-10-16 ENCOUNTER — Inpatient Hospital Stay (HOSPITAL_BASED_OUTPATIENT_CLINIC_OR_DEPARTMENT_OTHER): Payer: 59 | Admitting: Oncology

## 2023-10-16 ENCOUNTER — Encounter: Payer: Self-pay | Admitting: Oncology

## 2023-10-16 DIAGNOSIS — E538 Deficiency of other specified B group vitamins: Secondary | ICD-10-CM | POA: Diagnosis not present

## 2023-10-16 DIAGNOSIS — Z862 Personal history of diseases of the blood and blood-forming organs and certain disorders involving the immune mechanism: Secondary | ICD-10-CM | POA: Diagnosis not present

## 2023-10-16 DIAGNOSIS — D649 Anemia, unspecified: Secondary | ICD-10-CM | POA: Diagnosis not present

## 2023-10-16 MED ORDER — FOLIC ACID 1 MG PO TABS
1.0000 mg | ORAL_TABLET | Freq: Every day | ORAL | 3 refills | Status: AC
  Filled 2023-12-26 (×2): qty 90, 90d supply, fill #0

## 2023-10-16 NOTE — Progress Notes (Signed)
 Patient has been having headaches, body aches, and shortness of breath, which she did say that she was feeling better today than she was feeling yesterday.

## 2023-10-16 NOTE — Progress Notes (Signed)
 [facility]  [phone] [phone]  ID: Desiree Gaines OB: 1985-05-05  MR#: [id number]  RDW#:[id number]  Patient Care Team: Lemon Raisin, MD as PCP - General (Internal Medicine) Desiree Rolin BIRCH, LCSW as Social Worker (Licensed Clinical Social Worker) [name], [name], MD as Consulting Physician (Oncology) Desiree Gaines, CNM as Midwife (Obstetrics and Gynecology)  CHIEF COMPLAINT: History of iron  deficiency anemia as well as folic acid  deficiency.  INTERVAL HISTORY: Patient returns to clinic today for repeat laboratory work and further evaluation.  She currently feels well and is asymptomatic.  She does not complain of any weakness or fatigue today.  She has no neurologic complaints.  She denies any recent fevers or illnesses.  She has good appetite and denies weight loss.  She has no chest pain, shortness of breath, cough, or hemoptysis.  She denies any nausea, vomiting, constipation, or diarrhea.  She has no melena or hematochezia.  She has no urinary complaints.  Patient offers no specific complaints today.  REVIEW OF SYSTEMS:   Review of Systems  Constitutional: Negative.  Negative for fever, malaise/fatigue and weight loss.  Respiratory: Negative.  Negative for cough, hemoptysis and shortness of breath.   Cardiovascular: Negative.  Negative for chest pain and leg swelling.  Gastrointestinal: Negative.  Negative for abdominal pain, blood in stool and melena.  Genitourinary: Negative.  Negative for dysuria.  Musculoskeletal: Negative.  Negative for back pain.  Skin: Negative.  Negative for rash.  Neurological: Negative.  Negative for dizziness, focal weakness, weakness and headaches.  Psychiatric/Behavioral: Negative.  The patient is not nervous/anxious.     As per HPI. Otherwise, a complete review of systems is negative.  PAST MEDICAL HISTORY: Past Medical History:  Diagnosis Date   Adrenal nodule (HCC)    Anemia    Cancer  (HCC)    Graves disease    Hypothyroidism    Pituitary tumor    Thyroid  cancer (HCC)    Thyroid  disease    Umbilical hernia     PAST SURGICAL HISTORY: Past Surgical History:  Procedure Laterality Date   CESAREAN SECTION  06/2011   x 2   COLONOSCOPY WITH PROPOFOL  N/A 07/18/2021   Procedure: COLONOSCOPY WITH PROPOFOL ;  Surgeon: Therisa Bi, MD;  Location: [facility] ENDOSCOPY;  Service: Gastroenterology;  Laterality: N/A;   tubes in ears Bilateral     FAMILY HISTORY: Family History  Problem Relation Age of Onset   Diabetes Mother    Hypertension Mother    Stroke Mother    Prostate cancer Father    Hypertension Sister    Breast cancer Sister    Heart disease Brother     ADVANCED DIRECTIVES (Y/N):  N  HEALTH MAINTENANCE: Social History   Tobacco Use   Smoking status: Never    Passive exposure: Never   Smokeless tobacco: Never  Vaping Use   Vaping status: Never Used  Substance Use Topics   Alcohol use: Not Currently   Drug use: Never     Colonoscopy:  PAP:  Bone density:  Lipid panel:  No Known Allergies  Current Outpatient Medications  Medication Sig Dispense Refill   hydrOXYzine  (ATARAX ) 25 MG tablet Take 1 tablet (25 mg total) by mouth every 8 (eight) hours as needed for itching (anxiety or insomnia). 30 tablet 2   levothyroxine  (SYNTHROID ) 137 MCG tablet Take 1 tablet (137 mcg total) by mouth daily before breakfast. 90 tablet 1   medroxyPROGESTERone  (DEPO-PROVERA ) 150 MG/ML injection INJECT 1 ML (150 MG TOTAL)  INTO THE MUSCLE EVERY 3 (THREE) MONTHS 1 mL 0   cyanocobalamin  (VITAMIN B12) 1000 MCG tablet Take 1,000 mcg by mouth daily. Gummy otc (Patient not taking: Reported on 10/16/2023)     folic acid  (FOLVITE ) 1 MG tablet Take 1 tablet (1 mg total) by mouth daily. 90 tablet 3   Levothyroxine  Sodium 137 MCG CAPS Take 1 capsule (137 mcg total) by mouth daily before breakfast. (Patient not taking: Reported on 10/16/2023) 60 capsule 0   No current facility-administered  medications for this visit.    OBJECTIVE: Vitals:   10/16/23 1034  BP: 128/78  Pulse: 99  Resp: 18  Temp: 97.8 F (36.6 C)  SpO2: 100%     Body mass index is 40 kg/m.    ECOG FS:0 - Asymptomatic  General: Well-developed, well-nourished, no acute distress. Eyes: Pink conjunctiva, anicteric sclera. HEENT: Normocephalic, moist mucous membranes. Lungs: No audible wheezing or coughing. Heart: Regular rate and rhythm. Abdomen: Soft, nontender, no obvious distention. Musculoskeletal: No edema, cyanosis, or clubbing. Neuro: Alert, answering all questions appropriately. Cranial nerves grossly intact. Skin: No rashes or petechiae noted. Psych: Normal affect. Lymphatics: No cervical, calvicular, axillary or inguinal LAD.   LAB RESULTS:  Lab Results  Component Value Date   NA 139 05/19/2021   K 4.5 05/19/2021   CL 105 05/19/2021   CO2 20 05/19/2021   GLUCOSE 99 05/19/2021   BUN 11 05/19/2021   CREATININE 1.17 (H) 05/19/2021   CALCIUM 10.5 (H) 05/19/2021   PROT 6.9 08/07/2020   ALBUMIN 4.9 (H) 05/19/2021   AST 16 08/07/2020   ALT 25 08/07/2020   ALKPHOS 112 08/07/2020   BILITOT 0.6 08/07/2020   GFRNONAA >60 08/07/2020   GFRAA >60 07/17/2011    Lab Results  Component Value Date   WBC 7.7 10/15/2023   NEUTROABS 4.8 10/15/2023   HGB 11.7 (L) 10/15/2023   HCT 36.8 10/15/2023   MCV 98.1 10/15/2023   PLT 275 10/15/2023   Lab Results  Component Value Date   IRON  73 10/15/2023   TIBC 350 10/15/2023   IRONPCTSAT 21 10/15/2023   Lab Results  Component Value Date   FERRITIN 60 10/15/2023     STUDIES: No results found.  ASSESSMENT: History of iron  deficiency anemia as well as folic acid  deficiency.  PLAN:    Iron  deficiency anemia: Resolved.  Patient has a mildly decreased hemoglobin of 11.7, but her iron  stores continue to be within normal limits.  She has not required any IV Venofer  since May 2024.  After discussion with the patient, was agreed upon no further  follow-up is necessary.  Please refer patient back if there are any questions or concerns. Folic acid  deficiency: Patient's folic acid  levels remain persistently decreased, but she is admits to not taking supplements as prescribed.  Patient was given a prescription today and instructed to take 1 mg folic acid  daily.  Further refills and laboratory work can be completed by primary care.  I spent a total of 30 minutes reviewing chart data, face-to-face evaluation with the patient, counseling and coordination of care as detailed above.  Patient expressed understanding and was in agreement with this plan. She also understands that She can call clinic at any time with any questions, concerns, or complaints.    Evalene JINNY Reusing, MD   10/16/2023 2:40 PM

## 2023-10-22 ENCOUNTER — Ambulatory Visit

## 2023-10-22 VITALS — BP 135/84 | HR 103 | Ht 62.0 in | Wt 220.5 lb

## 2023-10-22 DIAGNOSIS — Z3042 Encounter for surveillance of injectable contraceptive: Secondary | ICD-10-CM

## 2023-10-22 MED ORDER — MEDROXYPROGESTERONE ACETATE 150 MG/ML IM SUSP
150.0000 mg | Freq: Once | INTRAMUSCULAR | Status: AC
  Administered 2023-10-22: 150 mg via INTRAMUSCULAR

## 2023-10-22 NOTE — Patient Instructions (Signed)

## 2023-10-22 NOTE — Progress Notes (Addendum)
    NURSE VISIT NOTE  Subjective:    Patient ID: Desiree Gaines, female    DOB: 03-09-1985, 38 y.o.   MRN: [id number]  HPI  Patient is a 38 y.o. G106P1002 female who presents for depo provera  injection.   Objective:    BP 135/84   Pulse (!) 103   Ht 5' 2 (1.575 m) Comment: patient reports  Wt 220 lb 8 oz (100 kg)   LMP  (Within Months)   BMI 40.33 kg/m   Last Annual: 08/31/22. Last pap: 09/19/2021. Last Depo-Provera : 07/23/23. Side Effects if any: none. Serum HCG indicated? No . Depo-Provera  150 mg IM given by: Rollo Maxin, CMA. Site: Left Deltoid  Lab Review  No results found for any visits on 10/22/23.  Assessment:   1. Encounter for surveillance of injectable contraceptive      Plan:   Next appointment due between 01/07/24 and 01/21/24.    Rollo JINNY Maxin, CMA

## 2023-11-12 ENCOUNTER — Other Ambulatory Visit: Payer: Self-pay

## 2023-11-12 MED ORDER — LEVOTHYROXINE SODIUM 137 MCG PO TABS: 137.0000 ug | ORAL_TABLET | Freq: Every day | ORAL | 0 refills | Status: DC

## 2023-11-19 ENCOUNTER — Ambulatory Visit (INDEPENDENT_AMBULATORY_CARE_PROVIDER_SITE_OTHER): Admitting: Licensed Practical Nurse

## 2023-11-19 ENCOUNTER — Encounter: Payer: Self-pay | Admitting: Licensed Practical Nurse

## 2023-11-19 VITALS — BP 131/61 | HR 137 | Ht 62.0 in | Wt 218.0 lb

## 2023-11-19 DIAGNOSIS — Z113 Encounter for screening for infections with a predominantly sexual mode of transmission: Secondary | ICD-10-CM

## 2023-11-19 DIAGNOSIS — M6208 Separation of muscle (nontraumatic), other site: Secondary | ICD-10-CM | POA: Diagnosis not present

## 2023-11-19 DIAGNOSIS — Z0000 Encounter for general adult medical examination without abnormal findings: Secondary | ICD-10-CM

## 2023-11-19 DIAGNOSIS — Z23 Encounter for immunization: Secondary | ICD-10-CM

## 2023-11-19 DIAGNOSIS — Z01419 Encounter for gynecological examination (general) (routine) without abnormal findings: Secondary | ICD-10-CM

## 2023-11-19 NOTE — Progress Notes (Signed)
 Gynecology Annual Exam   PCP: Lemon Raisin, MD  Chief Complaint:  Chief Complaint  Patient presents with   Annual Exam    History of Present Illness: Patient is a 38 y.o. G2P1002 presents for annual exam. The patient has no complaints today.   LMP: No LMP recorded (within months). Patient has had an injection.  Pt with distended abdomen and tenderness at the top of her abdomen, pt reports she had imaging in the past and was told nothing is wrong  The patient is rarely sexually active with 1 female partner. She currently uses Depo-Provera  injections for contraception. She denies dyspareunia.  The patient does not perform self breast exams.  There is no notable family history of breast or ovarian cancer in her family. Her sister had breast Cancer, she was in her 74's unsure if she had genetic testing.   The patient wears seatbelts: yes.   The patient has regular exercise: yes.  Goes to the gym once a week, tries to walk during the day   The patient denies current symptoms of depression.   Goes to an adult day program during the day, enjoys being outside.  Lives with her 2 children ages 90 and 51 Her daughter's father comes around, feels safe with him, they occasionally have IC.  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 DEVELOPMENTAL DELAY     Past Surgical History:  Past Surgical History:  Procedure Laterality Date   CESAREAN SECTION  06/2011   x 2   COLONOSCOPY WITH PROPOFOL  N/A 07/18/2021   Procedure: COLONOSCOPY WITH PROPOFOL ;  Surgeon: Therisa Bi, MD;  Location: [facility] ENDOSCOPY;  Service: Gastroenterology;  Laterality: N/A;   tubes in ears Bilateral     Gynecologic History:  No LMP recorded (within months). Patient has had an injection. Contraception: Depo-Provera  injections Last Pap: Results were: no abnormalities 2023  Obstetric History: G2P1002  Family History:  Family History  Problem Relation Age of Onset   Diabetes Mother    Hypertension Mother    Stroke Mother    Prostate cancer Father    Hypertension Sister    Breast cancer Sister    Heart disease Brother     Social History:  Social History   Socioeconomic History   Marital status: Single    Spouse name: Not on file   Number of children: 2   Years of education: Not on file   Highest education level: High school graduate  Occupational History   Occupation: disability  Tobacco Use   Smoking status: Never    Passive exposure: Never  Smokeless tobacco: Never  Vaping Use   Vaping status: Never Used  Substance and Sexual Activity   Alcohol use: Not Currently   Drug use: Never   Sexual activity: Not Currently    Birth control/protection: None, Injection  Other Topics Concern   Not on file  Social History Narrative   Pt has 2 children that live with her   Social Drivers of Corporate investment banker Strain: Low Risk  (08/01/2023)   Overall Financial Resource Strain (CARDIA)    Difficulty of Paying Living Expenses: Not hard at all   Food Insecurity: No Food Insecurity (08/01/2023)   Hunger Vital Sign    Worried About Running Out of Food in the Last Year: Never true    Ran Out of Food in the Last Year: Never true  Transportation Needs: No Transportation Needs (08/01/2023)   PRAPARE - Administrator, Civil Service (Medical): No    Lack of Transportation (Non-Medical): No  Physical Activity: Insufficiently Active (08/01/2023)   Exercise Vital Sign    Days of Exercise per Week: 1 day    Minutes of Exercise per Session: 20 min  Stress: No Stress Concern Present (08/01/2023)   [employer] of Occupational Health - Occupational Stress Questionnaire    Feeling of Stress: Not at all  Social Connections: Moderately Isolated (08/01/2023)   Social Connection and Isolation Panel    Frequency of Communication with Friends and Family: More than three times a week    Frequency of Social Gatherings with Friends and Family: More than three times a week    Attends Religious Services: More than 4 times per year    Active Member of [employer] or Organizations: No    Attends Banker Meetings: Never    Marital Status: Never married  Intimate Partner Violence: Not At Risk (08/01/2023)   Humiliation, Afraid, Rape, and Kick questionnaire    Fear of Current or Ex-Partner: No    Emotionally Abused: No    Physically Abused: No    Sexually Abused: No    Allergies:  No Known Allergies  Medications: Prior to Admission medications   Medication Sig Start Date End Date Taking? Authorizing Provider  cyanocobalamin  (VITAMIN B12) 1000 MCG tablet Take 1,000 mcg by mouth daily. Gummy otc   Yes [provider]  hydrOXYzine  (ATARAX ) 25 MG tablet Take 1 tablet (25 mg total) by mouth every 8 (eight) hours as needed for itching (anxiety or insomnia). 08/31/22  Yes [name], CNM  levothyroxine  (SYNTHROID ) 137 MCG tablet Take 1 tablet (137 mcg total) by mouth daily before breakfast. 11/12/23  Yes Lemon Raisin, MD   Levothyroxine  Sodium 137 MCG CAPS Take 1 capsule (137 mcg total) by mouth daily before breakfast. 11/17/22  Yes Joshua Cathryne BROCKS, MD  medroxyPROGESTERone  (DEPO-PROVERA ) 150 MG/ML injection INJECT 1 ML (150 MG TOTAL) INTO THE MUSCLE EVERY 3 (THREE) MONTHS 07/04/22  Yes Sebastian Sham, CNM  folic acid  (FOLVITE ) 1 MG tablet Take 1 tablet (1 mg total) by mouth daily. Patient not taking: Reported on 11/19/2023 10/16/23   [name] [name], MD    Physical Exam Vitals: Blood pressure 131/61, pulse (!) 137, height 5' 2 (1.575 m), weight 218 lb (98.9 kg).  General: NAD HEENT: normocephalic, anicteric Thyroid : no enlargement, no palpable nodules Pulmonary: No increased work of breathing, CTAB Cardiovascular: RRR, distal pulses 2+ Breast: Breast symmetrical, no tenderness, no palpable nodules or masses, no skin or nipple retraction present, no nipple discharge.  No axillary  or supraclavicular lymphadenopathy. Abdomen: NABS, soft, slightly tender tender, distended.  Wide diastasis recti, Umbilicus without lesions.  No hepatomegaly, splenomegaly or masses palpable. No evidence of hernia  Genitourinary:  External: Declined pelvic exam   Vagina:    Cervix:   Uterus:   Adnexa:   Rectal: deferred  Lymphatic: no evidence of inguinal lymphadenopathy Extremities: no edema, erythema, or tenderness Neurologic: Grossly intact Psychiatric: mood appropriate, affect full    Assessment: 38 y.o. G2P1002 routine annual exam  Plan: Problem List Items Addressed This Visit   None Visit Diagnoses       Diastasis recti    -  Primary   Relevant Orders   Ambulatory referral to Physical Therapy     Well woman exam with routine gynecological exam         Need for Tdap vaccination       Relevant Orders   Tdap vaccine greater than or equal to 7yo IM (Completed)     Well woman exam without gynecological exam       Relevant Orders   HEP, RPR, HIV Panel (Completed)   Hepatitis C Antibody (Completed)   VITAMIN  D 25 Hydroxy (Vit-D Deficiency, Fractures) (Completed)     Screening examination for venereal disease       Relevant Orders   HEP, RPR, HIV Panel (Completed)   Hepatitis C Antibody (Completed)   GC/Chlamydia Probe Amp (Completed)       2) STI screening  wasoffered and accepted  2)  ASCCP guidelines and rational discussed.  Patient opts for every 5 years screening interval  3) Contraception - the patient is currently using  Depo-Provera  injections.  She is happy with her current form of contraception and plans to continue  4) Routine healthcare maintenance including cholesterol, diabetes screening discussed managed by PCP  5) rec eye exam with glaucoma screening   Jinnie Cookey, CNM  [facility] 11/21/2023, 7:48 AM

## 2023-11-19 NOTE — Patient Instructions (Signed)
 Tdap (Tetanus, Diphtheria, Pertussis) Vaccine: What You Need to Know Many vaccine information statements are available in Spanish and other languages. See PromoAge.com.br. 1. Why get vaccinated? Tdap vaccine can prevent tetanus, diphtheria, and pertussis. Diphtheria and pertussis spread from person to person. Tetanus enters the body through cuts or wounds. TETANUS (T) causes painful stiffening of the muscles. Tetanus can lead to serious health problems, including being unable to open the mouth, having trouble swallowing and breathing, or death. DIPHTHERIA (D) can lead to difficulty breathing, heart failure, paralysis, or death. PERTUSSIS (aP), also known as "whooping cough," can cause uncontrollable, violent coughing that makes it hard to breathe, eat, or drink. Pertussis can be extremely serious especially in babies and young children, causing pneumonia, convulsions, brain damage, or death. In teens and adults, it can cause weight loss, loss of bladder control, passing out, and rib fractures from severe coughing. 2. Tdap vaccine Tdap is only for children 7 years and older, adolescents, and adults.  Adolescents should receive a single dose of Tdap, preferably at age 76 or 12 years. Pregnant people should get a dose of Tdap during every pregnancy, preferably during the early part of the third trimester, to help protect the newborn from pertussis. Infants are most at risk for severe, life-threatening complications from pertussis. Adults who have never received Tdap should get a dose of Tdap. Also, adults should receive a booster dose of either Tdap or Td (a different vaccine that protects against tetanus and diphtheria but not pertussis) every 10 years, or after 5 years in the case of a severe or dirty wound or burn. Tdap may be given at the same time as other vaccines. 3. Talk with your health care provider Tell your vaccine provider if the person getting the vaccine: Has had an allergic  reaction after a previous dose of any vaccine that protects against tetanus, diphtheria, or pertussis, or has any severe, life-threatening allergies Has had a coma, decreased level of consciousness, or prolonged seizures within 7 days after a previous dose of any pertussis vaccine (DTP, DTaP, or Tdap) Has seizures or another nervous system problem Has ever had Guillain-Barr Syndrome (also called "GBS") Has had severe pain or swelling after a previous dose of any vaccine that protects against tetanus or diphtheria In some cases, your health care provider may decide to postpone Tdap vaccination until a future visit. People with minor illnesses, such as a cold, may be vaccinated. People who are moderately or severely ill should usually wait until they recover before getting Tdap vaccine.  Your health care provider can give you more information. 4. Risks of a vaccine reaction Pain, redness, or swelling where the shot was given, mild fever, headache, feeling tired, and nausea, vomiting, diarrhea, or stomachache sometimes happen after Tdap vaccination. People sometimes faint after medical procedures, including vaccination. Tell your provider if you feel dizzy or have vision changes or ringing in the ears.  As with any medicine, there is a very remote chance of a vaccine causing a severe allergic reaction, other serious injury, or death. 5. What if there is a serious problem? An allergic reaction could occur after the vaccinated person leaves the clinic. If you see signs of a severe allergic reaction (hives, swelling of the face and throat, difficulty breathing, a fast heartbeat, dizziness, or weakness), call 9-1-1 and get the person to the nearest hospital. For other signs that concern you, call your health care provider.  Adverse reactions should be reported to the Vaccine Adverse Event Reporting  System (VAERS). Your health care provider will usually file this report, or you can do it yourself. Visit the  VAERS website at www.vaers.LAgents.no or call 437-731-6503. VAERS is only for reporting reactions, and VAERS staff members do not give medical advice. 6. The National Vaccine Injury Compensation Program The [employer] Vaccine Injury Compensation Program (VICP) is a federal program that was created to compensate people who may have been injured by certain vaccines. Claims regarding alleged injury or death due to vaccination have a time limit for filing, which may be as short as two years. Visit the VICP website at SpiritualWord.at or call (669) 837-1631 to learn about the program and about filing a claim. 7. How can I learn more? Ask your health care provider. Call your local or state health department. Visit the website of the Food and Drug Administration (FDA) for vaccine package inserts and additional information at FinderList.no. Contact the Centers for Disease Control and Prevention (CDC): Call (484) 483-2759 (1-800-CDC-INFO) or Visit CDC's website at PicCapture.uy. Source: CDC Vaccine Information Statement Tdap (Tetanus, Diphtheria, Pertussis) Vaccine (09/12/2019) This same material is available at FootballExhibition.com.br for no charge. This information is not intended to replace advice given to you by your health care provider. Make sure you discuss any questions you have with your health care provider. Document Revised: 05/10/2022 Document Reviewed: 03/10/2022 Elsevier Patient Education  2024 [employer].

## 2023-11-20 ENCOUNTER — Encounter: Admitting: Student

## 2023-11-20 LAB — HEP, RPR, HIV PANEL
HIV Screen 4th Generation wRfx: NONREACTIVE
Hepatitis B Surface Ag: NEGATIVE
RPR Ser Ql: NONREACTIVE

## 2023-11-20 LAB — VITAMIN D 25 HYDROXY (VIT D DEFICIENCY, FRACTURES): Vit D, 25-Hydroxy: 9.6 ng/mL — ABNORMAL LOW (ref 30.0–100.0)

## 2023-11-20 LAB — HEPATITIS C ANTIBODY: Hep C Virus Ab: NONREACTIVE

## 2023-11-21 LAB — GC/CHLAMYDIA PROBE AMP
Chlamydia trachomatis, NAA: NEGATIVE
Neisseria Gonorrhoeae by PCR: NEGATIVE

## 2023-11-21 NOTE — Addendum Note (Signed)
 Addended by: Antiono Ettinger on: 11/21/2023 07:48 AM   Modules accepted: Orders

## 2023-11-22 ENCOUNTER — Other Ambulatory Visit: Payer: Self-pay | Admitting: Licensed Practical Nurse

## 2023-11-22 ENCOUNTER — Ambulatory Visit: Payer: Self-pay | Admitting: Licensed Practical Nurse

## 2023-11-22 ENCOUNTER — Encounter: Payer: Self-pay | Admitting: Licensed Practical Nurse

## 2023-11-22 DIAGNOSIS — E559 Vitamin D deficiency, unspecified: Secondary | ICD-10-CM | POA: Insufficient documentation

## 2023-11-22 MED ORDER — CHOLECALCIFEROL 1.25 MG (50000 UT) PO TABS
1.0000 | ORAL_TABLET | Freq: Every day | ORAL | 0 refills | Status: DC
Start: 2023-11-22 — End: 2023-11-26

## 2023-11-26 ENCOUNTER — Ambulatory Visit (INDEPENDENT_AMBULATORY_CARE_PROVIDER_SITE_OTHER): Admitting: Student

## 2023-11-26 ENCOUNTER — Encounter: Payer: Self-pay | Admitting: Student

## 2023-11-26 VITALS — BP 122/82 | HR 97 | Ht 62.0 in | Wt 217.5 lb

## 2023-11-26 DIAGNOSIS — E559 Vitamin D deficiency, unspecified: Secondary | ICD-10-CM | POA: Diagnosis not present

## 2023-11-26 DIAGNOSIS — E538 Deficiency of other specified B group vitamins: Secondary | ICD-10-CM

## 2023-11-26 DIAGNOSIS — D500 Iron deficiency anemia secondary to blood loss (chronic): Secondary | ICD-10-CM

## 2023-11-26 DIAGNOSIS — J029 Acute pharyngitis, unspecified: Secondary | ICD-10-CM

## 2023-11-26 DIAGNOSIS — E890 Postprocedural hypothyroidism: Secondary | ICD-10-CM

## 2023-11-26 LAB — POCT RAPID STREP A: Rapid Strep A Screen: NEGATIVE — NL

## 2023-11-26 MED ORDER — VITAMIN D (ERGOCALCIFEROL) 1.25 MG (50000 UNIT) PO CAPS: 50000.0000 [IU] | ORAL_CAPSULE | ORAL | 0 refills | Status: DC

## 2023-11-26 NOTE — Progress Notes (Unsigned)
 Established Patient Office Visit  Subjective   Patient ID: Desiree Gaines, female    DOB: [birth year]  Age: 38 y.o. MRN: [id number]  Chief Complaint  Patient presents with   Establish Care    Patient is here to establish care with new PCP   Sore Throat    Neck swelling, first noticed Saturday, hurts to swallow     [name] with medical hx listed below presents today for transfer of care.  Reports sore throat with associated pain with swallowing and talking for the past 2 days. No fever, chills, shortness of breath, cough, sinus pain, dental pain, or inability to tolerate food/drinks.   Patient Active Problem List   Diagnosis Date Noted   Vitamin D deficiency 11/22/2023   B12 deficiency 02/15/2023   Folate deficiency 09/12/2022   Diastasis of rectus abdominis 10/27/2021   Chronic constipation 09/02/2020   Encounter for screening colonoscopy 09/02/2020   Ventral hernia without obstruction or gangrene 08/12/2020   Facial cellulitis 08/02/2020   Hypothyroidism 08/02/2020   Iron  deficiency anemia 03/06/2011   Developmental delay 06/19/2000      ROS Refer to HPI    Objective:     Outpatient Encounter Medications as of 11/26/2023  Medication Sig   levothyroxine  (SYNTHROID ) 137 MCG tablet Take 1 tablet (137 mcg total) by mouth daily before breakfast.   medroxyPROGESTERone  (DEPO-PROVERA ) 150 MG/ML injection INJECT 1 ML (150 MG TOTAL) INTO THE MUSCLE EVERY 3 (THREE) MONTHS   Vitamin D, Ergocalciferol, (DRISDOL) 1.25 MG (50000 UNIT) CAPS capsule Take 1 capsule (50,000 Units total) by mouth every 7 (seven) days.   folic acid  (FOLVITE ) 1 MG tablet Take 1 tablet (1 mg total) by mouth daily. (Patient not taking: Reported on 11/26/2023)   hydrOXYzine  (ATARAX ) 25 MG tablet Take 1 tablet (25 mg total) by mouth every 8 (eight) hours as needed for itching (anxiety or insomnia). (Patient not taking: Reported on 11/26/2023)   [DISCONTINUED] Cholecalciferol 1.25 MG (50000 UT)  TABS Take 1 tablet by mouth daily. (Patient not taking: Reported on 11/26/2023)   [DISCONTINUED] cyanocobalamin  (VITAMIN B12) 1000 MCG tablet Take 1,000 mcg by mouth daily. Gummy otc (Patient not taking: Reported on 11/26/2023)   [DISCONTINUED] Levothyroxine  Sodium 137 MCG CAPS Take 1 capsule (137 mcg total) by mouth daily before breakfast. (Patient not taking: Reported on 11/26/2023)   No facility-administered encounter medications on file as of 11/26/2023.    BP 122/82   Pulse 97   Ht 5' 2 (1.575 m)   Wt 217 lb 8 oz (98.7 kg)   SpO2 97%   BMI 39.78 kg/m  BP Readings from Last 3 Encounters:  11/26/23 122/82  11/19/23 131/61  10/22/23 135/84    Physical Exam Constitutional:      Appearance: Normal appearance. She is well-developed.  HENT:     Head: Normocephalic and atraumatic.     Mouth/Throat:     Mouth: Mucous membranes are moist.     Pharynx: Oropharynx is clear. Uvula midline. No oropharyngeal exudate, posterior oropharyngeal erythema or uvula swelling.     Tonsils: No tonsillar exudate or tonsillar abscesses.  Eyes:     Conjunctiva/sclera: Conjunctivae normal.     Pupils: Pupils are equal, round, and reactive to light.  Neck:     Thyroid : No thyromegaly.  Cardiovascular:     Rate and Rhythm: Normal rate and regular rhythm.     Heart sounds: No murmur heard. Pulmonary:     Effort: Pulmonary effort is normal.  Breath sounds: No rhonchi or rales.  Abdominal:     General: Abdomen is flat. Bowel sounds are normal. There is no distension.     Palpations: Abdomen is soft.     Tenderness: There is no abdominal tenderness.  Musculoskeletal:        General: Normal range of motion.     Cervical back: Normal range of motion.     Right lower leg: No edema.     Left lower leg: No edema.  Lymphadenopathy:     Cervical: No cervical adenopathy.  Skin:    General: Skin is warm and dry.     Capillary Refill: Capillary refill takes less than 2 seconds.  Neurological:      General: No focal deficit present.     Mental Status: She is alert and oriented to person, place, and time.  Psychiatric:        Mood and Affect: Mood normal.        Behavior: Behavior normal.        08/01/2023   10:10 AM 05/21/2023   11:04 AM 01/18/2023    3:38 PM  Depression screen PHQ 2/9  Decreased Interest 0 0 0  Down, Depressed, Hopeless 0 0 0  PHQ - 2 Score 0 0 0  Altered sleeping 1 2 2   Tired, decreased energy 1 2 1   Change in appetite 0 0 0  Feeling bad or failure about yourself  0 0 0  Trouble concentrating 1 0 0  Moving slowly or fidgety/restless 0 1 0  Suicidal thoughts 0 0 0  PHQ-9 Score 3 5 3   Difficult doing work/chores Not difficult at all  Not difficult at all       05/21/2023   11:04 AM 01/18/2023    3:38 PM 11/16/2022    3:41 PM 10/31/2022    3:52 PM  GAD 7 : Generalized Anxiety Score  Nervous, Anxious, on Edge 3 2 2  0  Control/stop worrying 2 2 3  0  Worry too much - different things 3 2 2  0  Trouble relaxing 0 1 1 0  Restless 2 0 0 0  Easily annoyed or irritable 2 0 0 0  Afraid - awful might happen 0 0 0 0  Total GAD 7 Score 12 7 8  0  Anxiety Difficulty Not difficult at all Not difficult at all Not difficult at all Not difficult at all    Results for orders placed or performed in visit on 11/26/23  TSH + free T4  Result Value Ref Range   TSH 21.000 (H) 0.450 - 4.500 uIU/mL   Free T4 0.93 0.82 - 1.77 ng/dL  POCT Rapid Strep A  Result Value Ref Range   Rapid Strep A Screen Negative Negative    Last CBC Lab Results  Component Value Date   WBC 7.7 10/15/2023   HGB 11.7 (L) 10/15/2023   HCT 36.8 10/15/2023   MCV 98.1 10/15/2023   MCH 31.2 10/15/2023   RDW 14.3 10/15/2023   PLT 275 10/15/2023   Last metabolic panel Lab Results  Component Value Date   GLUCOSE 99 05/19/2021   NA 139 05/19/2021   K 4.5 05/19/2021   CL 105 05/19/2021   CO2 20 05/19/2021   BUN 11 05/19/2021   CREATININE 1.17 (H) 05/19/2021   EGFR 63 05/19/2021    CALCIUM 10.5 (H) 05/19/2021   PHOS 2.7 (L) 05/19/2021   PROT 6.9 08/07/2020   ALBUMIN 4.9 (H) 05/19/2021   LABGLOB 3.2 07/12/2020  AGRATIO 1.4 07/12/2020   BILITOT 0.6 08/07/2020   ALKPHOS 112 08/07/2020   AST 16 08/07/2020   ALT 25 08/07/2020   ANIONGAP 3 (L) 08/07/2020   Last lipids Lab Results  Component Value Date   CHOL 173 05/19/2021   HDL 49 05/19/2021   LDLCALC 95 05/19/2021   TRIG 171 (H) 05/19/2021   Last hemoglobin A1c No results found for: HGBA1C Last thyroid  functions Lab Results  Component Value Date   TSH 21.000 (H) 11/26/2023   T4TOTAL 10.3 11/16/2022   FREET4 0.93 11/26/2023      The ASCVD Risk score (Arnett DK, et al., 2019) failed to calculate for the following reasons:   The 2019 ASCVD risk score is only valid for ages 6 to 81    Assessment & Plan:  Sore throat Negative for strep today. Likely viral illness. ED precautions if having significant difficulties with swallowing. Follow up if not improving.  -     POCT Rapid Strep A  Postablative hypothyroidism Assessment & Plan: Hx of graves disease s/p ablation. Taking levothyroxine  137 mcg daily, forgets to take it about 2x a week. TSH and T4 today. Discussed importance of taking medication consistently.   Orders: -     TSH + free T4  Vitamin D deficiency Assessment & Plan: Vitamin D 9.6 on 10/13, has not started vitamin D supplementation. Start Vitamin D 50000 units weekly for 8 weeks then decrease to 1000 units daily. Will follow up level at next visit    Iron  deficiency anemia due to chronic blood loss Assessment & Plan: This has resolved with IV iron  through hematology and has been discharged. Will continue to monitor CBC periodically.    Folate deficiency Assessment & Plan: Has not picked up folate due to cost. Referral to [facility] for pharmacy and med assistance.    Other orders -     Vitamin D (Ergocalciferol); Take 1 capsule (50,000 Units total) by mouth every 7 (seven) days.   Dispense: 8 capsule; Refill: 0     Return in about 3 months (around 02/26/2024).    Harlene Saddler, MD

## 2023-11-26 NOTE — Assessment & Plan Note (Signed)
 Taking levothyroxine  137 mcg daily, forgets to take it about 2x a week.

## 2023-11-27 ENCOUNTER — Ambulatory Visit: Payer: Self-pay | Admitting: Student

## 2023-11-27 DIAGNOSIS — E538 Deficiency of other specified B group vitamins: Secondary | ICD-10-CM

## 2023-11-27 DIAGNOSIS — E890 Postprocedural hypothyroidism: Secondary | ICD-10-CM

## 2023-11-27 DIAGNOSIS — E559 Vitamin D deficiency, unspecified: Secondary | ICD-10-CM

## 2023-11-27 LAB — TSH+FREE T4
Free T4: 0.93 ng/dL (ref 0.82–1.77)
TSH: 21 u[IU]/mL — ABNORMAL HIGH (ref 0.450–4.500)

## 2023-11-28 ENCOUNTER — Telehealth: Payer: Self-pay

## 2023-11-28 ENCOUNTER — Encounter: Payer: Self-pay | Admitting: Student

## 2023-11-28 NOTE — Assessment & Plan Note (Signed)
 Vitamin D 9.6 on 10/13, has not started vitamin D supplementation. Start Vitamin D 50000 units weekly for 8 weeks then decrease to 1000 units daily. Will follow up level at next visit

## 2023-11-28 NOTE — Progress Notes (Signed)
 Care Guide Pharmacy Note  11/28/2023 Name: SHANINE KREIGER MRN: [id number] DOB: 30-Mar-1985  Referred By: Lemon Raisin, MD Reason for referral: Complex Care Management (Outreach to schedule with Pharm d )   KINBERLY PERRIS is a 38 y.o. year old female who is a primary care patient of Lemon Raisin, MD.  [name] was referred to the pharmacist for assistance related to: hypothyroidism   Successful contact was made with the patient to discuss pharmacy services including being ready for the pharmacist to call at least 5 minutes before the scheduled appointment time and to have medication bottles and any blood pressure readings ready for review. The patient agreed to meet with the pharmacist via telephone visit on (date/time).12/05/2023  Jeoffrey Buffalo , RMA     [place]  [facility] Guide  Direct Dial: [phone]  Website: [email or web address]

## 2023-11-28 NOTE — Assessment & Plan Note (Signed)
 This has resolved with IV iron  through hematology and has been discharged. Will continue to monitor CBC periodically.

## 2023-11-28 NOTE — Assessment & Plan Note (Signed)
 Has not picked up folate due to cost. Referral to [facility] for pharmacy and med assistance.

## 2023-12-05 ENCOUNTER — Telehealth: Payer: Self-pay | Admitting: Pharmacist

## 2023-12-05 ENCOUNTER — Other Ambulatory Visit (HOSPITAL_COMMUNITY): Payer: Self-pay

## 2023-12-05 ENCOUNTER — Encounter: Payer: Self-pay | Admitting: Internal Medicine

## 2023-12-05 NOTE — Progress Notes (Signed)
   Outreach Note  12/05/2023 Name: Desiree Gaines MRN: [id number] DOB: 1985-08-21  Referred by: Lemon Raisin, MD Reason for referral : Medication Assistance and Medication Adherence  Was unable to reach patient via telephone x 2 today and unable to leave a message as voicemail box is full   Follow Up Plan: Will collaborate with Care Guide to outreach to schedule follow up with me  Sharyle Sia, PharmD, [facility] [phone]

## 2023-12-11 ENCOUNTER — Other Ambulatory Visit: Payer: Self-pay | Admitting: Student

## 2023-12-12 NOTE — Telephone Encounter (Signed)
 Requested Prescriptions  Pending Prescriptions Disp Refills   levothyroxine  (SYNTHROID ) 137 MCG tablet [Pharmacy Med Name: LEVOTHYROXINE  137 MCG TABLET] 90 tablet 0    Sig: TAKE 1 TABLET BY MOUTH DAILY BEFORE BREAKFAST.     Endocrinology:  Hypothyroid Agents Failed - 12/12/2023  3:43 PM      Failed - TSH in normal range and within 360 days    TSH  Date Value Ref Range Status  11/26/2023 21.000 (H) 0.450 - 4.500 uIU/mL Final         Passed - Valid encounter within last 12 months    Recent Outpatient Visits           2 weeks ago Sore throat   [facility], MD   6 months ago Acquired hypothyroidism   [facility] at [facility] Cathryne JAYSON, MD

## 2023-12-14 ENCOUNTER — Telehealth: Payer: Self-pay | Admitting: Pharmacist

## 2023-12-14 NOTE — Progress Notes (Signed)
   Outreach Note  12/14/2023 Name: Desiree Gaines MRN: [id number] DOB: Jul 09, 1985  Referred by: Lemon Raisin, MD  Was unable to reach patient via telephone x 2 today and unable to leave a message as voicemail box is full. Outreach attempt #2   Follow Up Plan: Collaborating with Care Guide to outreach to schedule follow up with me  Sharyle Sia, PharmD, [facility] [phone]

## 2023-12-26 ENCOUNTER — Encounter: Payer: Self-pay | Admitting: Internal Medicine

## 2023-12-26 ENCOUNTER — Other Ambulatory Visit: Payer: Self-pay | Admitting: Pharmacist

## 2023-12-26 ENCOUNTER — Other Ambulatory Visit (HOSPITAL_COMMUNITY): Payer: Self-pay

## 2023-12-26 DIAGNOSIS — E890 Postprocedural hypothyroidism: Secondary | ICD-10-CM

## 2023-12-26 DIAGNOSIS — E538 Deficiency of other specified B group vitamins: Secondary | ICD-10-CM

## 2023-12-26 DIAGNOSIS — E559 Vitamin D deficiency, unspecified: Secondary | ICD-10-CM

## 2023-12-26 MED ORDER — LEVOTHYROXINE SODIUM 137 MCG PO TABS
137.0000 ug | ORAL_TABLET | Freq: Every day | ORAL | 0 refills | Status: DC
  Filled 2023-12-26 (×2): qty 90, 90d supply, fill #0

## 2023-12-26 NOTE — Patient Instructions (Addendum)
 Please follow up with [facility] regarding receiving your levothyroxine  and folic acid  prescriptions by mail  [facility] [address] [place], [place], [place] [address] [phone]   Please pick up Over the Counter Vitamin D3 5,000 international units a day for 8 weeks then take 1,000 international units a day, as recommended by your OBGYN.  Thank you!  Sharyle Sia, PharmD, [facility] [phone]

## 2023-12-26 NOTE — Progress Notes (Signed)
 12/26/2023 Name: Desiree Gaines MRN: [id number] DOB: [birth year]  Chief Complaint  Patient presents with   Medication Adherence    Desiree Gaines is a 38 y.o. year old female who was referred to the pharmacist by their PCP for assistance in managing medication access/adherence  Today reach patient by telephone. Patient requests that I also reach out to Peer Support Worker Nat "Lacey Molt at [phone]. Contact Nicole by telephone as well.   Subjective:  Care Team: Primary Care Provider: Lemon Raisin, MD ; Next Scheduled Visit: 02/26/2024 OBGYN Dominic, Jinnie Jansky, CNM; Next Scheduled Visit: 01/07/2024 Hematologist: [name] [name], MD   Medication Access/Adherence  [phone] [place] [place] - [address] [place] [address] Phone: [phone] Fax: [phone]   Patient reports affordability concerns with their medications: Yes  Patient reports access/transportation concerns to their pharmacy: Yes  Patient reports adherence concerns with their medications:  Yes    Reports that she has difficulty with taking her medications consistently. Reports is currently out of her levothyroxine  and has not picked up refill due to lack of transportation. States sometime her peer support worker can help her to pick up medications, but would like to use a pharmacy that could deliver/mail to her home  Also states that she never started taking folic acid , as prescribed by Dr. [name], due to cost of medication (not covered through her prescription plan) and never started weekly Vitamin D prescription, as prescribed by her OBGYN, both due to cost of medication (not covered through her prescription plan) and because she is unable to swallow these capsules - From review of lab message in chart from 11/22/2023, note OBGYN also advise that for Vitamin D, patient could instead take 5,000 international units a day for 8 weeks then take  1,000 international units a day, both of which are available over the counter    Objective:  Lab Results  Component Value Date   CREATININE 1.17 (H) 05/19/2021   BUN 11 05/19/2021   NA 139 05/19/2021   K 4.5 05/19/2021   CL 105 05/19/2021   CO2 20 05/19/2021    Lab Results  Component Value Date   CHOL 173 05/19/2021   HDL 49 05/19/2021   LDLCALC 95 05/19/2021   TRIG 171 (H) 05/19/2021    Current Outpatient Medications on File Prior to Visit  Medication Sig Dispense Refill   folic acid  (FOLVITE ) 1 MG tablet Take 1 tablet (1 mg total) by mouth daily. (Patient not taking: Reported on 11/26/2023) 90 tablet 3   hydrOXYzine  (ATARAX ) 25 MG tablet Take 1 tablet (25 mg total) by mouth every 8 (eight) hours as needed for itching (anxiety or insomnia). (Patient not taking: Reported on 11/26/2023) 30 tablet 2   levothyroxine  (SYNTHROID ) 137 MCG tablet TAKE 1 TABLET BY MOUTH DAILY BEFORE BREAKFAST. 90 tablet 0   medroxyPROGESTERone  (DEPO-PROVERA ) 150 MG/ML injection INJECT 1 ML (150 MG TOTAL) INTO THE MUSCLE EVERY 3 (THREE) MONTHS 1 mL 0   Vitamin D, Ergocalciferol, (DRISDOL) 1.25 MG (50000 UNIT) CAPS capsule Take 1 capsule (50,000 Units total) by mouth every 7 (seven) days. 8 capsule 0   No current facility-administered medications on file prior to visit.        Assessment/Plan:   Today discuss with patient the importance of medication adherence. Recommend starting to use weekly pillbox to aid with adherence. - Peer Environmental Manager states that she can help patient with getting weekly pillbox and  fill this for patient every Monday - Discuss recommendation to take levothyroxine  doses in the morning on an empty stomach, at least 30 minutes before breakfast  Discuss options for deliver/mail order prescriptions. Patient is interested in using [facility] - Patient confirms mailing address is: [address]; [place] [address]  - Collaborate with [facility] to request pharmacy transfer and fill levothyroxine  and folic acid  [facility] to [facility]   Advise patient of OTC option for Vitamin D  from OBGYN. Patient plans to pick up chewable Vitamin D  5,000 units and start taking this daily. Advise patient that she can use her [employer] OTC card to purchase this.   Follow Up Plan: Clinical Pharmacist will follow up with patient by telephone on 01/09/2024 at 2:00 PM   Sharyle Sia, PharmD, [facility] [phone]

## 2023-12-27 ENCOUNTER — Encounter: Payer: Self-pay | Admitting: Pharmacist

## 2023-12-27 ENCOUNTER — Other Ambulatory Visit: Payer: Self-pay

## 2024-01-01 ENCOUNTER — Other Ambulatory Visit: Payer: Self-pay

## 2024-01-07 ENCOUNTER — Ambulatory Visit

## 2024-01-07 ENCOUNTER — Encounter: Payer: Self-pay | Admitting: Internal Medicine

## 2024-01-07 VITALS — BP 137/91 | HR 99 | Ht 62.0 in | Wt 222.0 lb

## 2024-01-07 DIAGNOSIS — Z3042 Encounter for surveillance of injectable contraceptive: Secondary | ICD-10-CM | POA: Diagnosis not present

## 2024-01-07 MED ORDER — MEDROXYPROGESTERONE ACETATE 150 MG/ML IM SUSY
150.0000 mg | PREFILLED_SYRINGE | Freq: Once | INTRAMUSCULAR | Status: AC
  Administered 2024-01-07: 150 mg via INTRAMUSCULAR

## 2024-01-07 NOTE — Progress Notes (Signed)
    NURSE VISIT NOTE  Subjective:    Patient ID: Desiree Gaines, female    DOB: Oct 04, 1985, 38 y.o.   MRN: [id number]  HPI  Patient is a 38 y.o. [name] female who presents for depo provera  injection.   Objective:    BP (!) 132/91   Pulse 96   Ht 5' 2 (1.575 m)   Wt 222 lb (100.7 kg)   BMI 40.60 kg/m   Last Annual: 11/19/23. Last pap: 09/19/21. Last Depo-Provera : 10/22/23. Side Effects if any: none. Serum HCG indicated? No . Depo-Provera  150 mg IM given by: Desiree Gaines, CMA. Site: Right [place]  Lab Review  No results found for any visits on 01/07/24.  Assessment:   1. Encounter for Depo-Provera  contraception      Plan:   Next appointment due between Feb 16th  and March 2nd.    Desiree Gaines, CMA

## 2024-01-09 ENCOUNTER — Telehealth: Payer: Self-pay

## 2024-01-09 ENCOUNTER — Other Ambulatory Visit (HOSPITAL_COMMUNITY): Payer: Self-pay

## 2024-01-09 ENCOUNTER — Other Ambulatory Visit: Payer: Self-pay | Admitting: Pharmacist

## 2024-01-09 DIAGNOSIS — E538 Deficiency of other specified B group vitamins: Secondary | ICD-10-CM

## 2024-01-09 DIAGNOSIS — E890 Postprocedural hypothyroidism: Secondary | ICD-10-CM

## 2024-01-09 DIAGNOSIS — E559 Vitamin D deficiency, unspecified: Secondary | ICD-10-CM

## 2024-01-09 NOTE — Patient Instructions (Addendum)
 [facility] to [facility]:  - levothyroxine  prescription - folic acid  prescription - Vitamin D3 5,000 units (from Over the counter)  [id number] [place] [place] - [address], [place] [address] Phone: [phone]   Your pharmacy can help you with finding which Vitamin D  product will go through on your OTC card from your insurance.  Thank you!  Sharyle Sia, PharmD, [facility] [phone]

## 2024-01-09 NOTE — Telephone Encounter (Signed)
 Pharmacy Patient Advocate Encounter  Insurance verification completed.   The patient is insured through [employer] claim for levothyroxine . Currently a quantity of 90 is a 90 day supply and the co-pay is $0 .   This test claim was processed through [facility]- copay amounts may vary at other pharmacies due to pharmacy/plan contracts, or as the patient moves through the different stages of their insurance plan.

## 2024-01-09 NOTE — Progress Notes (Signed)
 01/09/2024 Name: Desiree Gaines MRN: [id number] DOB: [birth year]  Chief Complaint  Patient presents with   Medication Adherence    ALEXSUS PAPADOPOULOS is a 38 y.o. year old female who presented for a telephone visit.   They were referred to the pharmacist by their PCP for assistance in managing medication access/adherence.   Today reach both patient and Peer Support Worker Nat Molt   Subjective:  Care Team: Primary Care Provider: Lemon Raisin, MD ; Next Scheduled Visit: 02/26/2024 OBGYN Dominic, Jinnie Jansky, CNM; Next Scheduled Visit: 03/28/2024 Hematologist: [name] [name], MD   Medication Access/Adherence  [phone] [place] [place] - [address] [place] [address] Phone: [phone] Fax: [phone]   Patient reports affordability concerns with their medications: Yes  Patient reports access/transportation concerns to their pharmacy: Yes  Patient reports adherence concerns with their medications:  Yes    Report that patient has not yet started levothyroxine  or folic acid . Report decided that it is easier to pick up from a local pharmacy rather than put a payment method on file with pharmacy for shipment of the medication (as required for prescription to be mailed) - Prefers to [facility] [place], but denies having yet [facility] to have [facility]  Report also has not yet picked up OTC Vitamin D  5,000 units.   Objective:  Lab Results  Component Value Date   CREATININE 1.17 (H) 05/19/2021   BUN 11 05/19/2021   NA 139 05/19/2021   K 4.5 05/19/2021   CL 105 05/19/2021   CO2 20 05/19/2021    Lab Results  Component Value Date   CHOL 173 05/19/2021   HDL 49 05/19/2021   LDLCALC 95 05/19/2021   TRIG 171 (H) 05/19/2021    Current Outpatient Medications on File Prior to Visit  Medication Sig Dispense Refill   Cholecalciferol  (VITAMIN D3) 125 MCG (5000 UT)  CHEW Chew 1 tablet by mouth daily.     folic acid  (FOLVITE ) 1 MG tablet Take 1 tablet (1 mg total) by mouth daily. (Patient not taking: Reported on 11/26/2023) 90 tablet 3   hydrOXYzine  (ATARAX ) 25 MG tablet Take 1 tablet (25 mg total) by mouth every 8 (eight) hours as needed for itching (anxiety or insomnia). (Patient not taking: Reported on 11/26/2023) 30 tablet 2   levothyroxine  (SYNTHROID ) 137 MCG tablet TAKE 1 TABLET BY MOUTH DAILY BEFORE BREAKFAST. 90 tablet 0   levothyroxine  (SYNTHROID ) 137 MCG tablet Take 1 tablet (137 mcg total) by mouth daily before breakfast. 90 tablet 0   medroxyPROGESTERone  (DEPO-PROVERA ) 150 MG/ML injection INJECT 1 ML (150 MG TOTAL) INTO THE MUSCLE EVERY 3 (THREE) MONTHS 1 mL 0   No current facility-administered medications on file prior to visit.       Assessment/Plan:   Have discussed importance of medication adherence. Peer Support Worker has stated that she can help patient with getting weekly pillbox and fill this for patient every Monday  Provide counseling on process for having prescriptions transferred to receive through an alternate pharmacy.   [facility] on [facility] of patient and speak with Constance. [facility]  and folic acid  prescriptions from [facility] and fill these for patient.  Counsel on option to use GoodRx coupon for cost savings for folic acid  prescription if needed, as not covered by patient's insurance plan   Advise patient to [facility] with using her [employer] OTC card to purchase chewable Vitamin D  5,000 units and start taking this daily.     Follow Up Plan: Clinical Pharmacist will follow up with patient by telephone on 01/11/2024 at 12:00 PM     Sharyle Sia, PharmD, [facility] [phone]

## 2024-01-11 ENCOUNTER — Other Ambulatory Visit: Admitting: Pharmacist

## 2024-01-14 ENCOUNTER — Other Ambulatory Visit: Payer: Self-pay | Admitting: Pharmacist

## 2024-01-14 DIAGNOSIS — E890 Postprocedural hypothyroidism: Secondary | ICD-10-CM

## 2024-01-14 DIAGNOSIS — E559 Vitamin D deficiency, unspecified: Secondary | ICD-10-CM

## 2024-01-14 DIAGNOSIS — E538 Deficiency of other specified B group vitamins: Secondary | ICD-10-CM

## 2024-01-14 NOTE — Progress Notes (Signed)
   01/14/2024  Patient ID: Desiree Gaines, female   DOB: [birth year], 38 y.o.   MRN: [id number]  Outreach to patient today to follow up regarding medication access/adherence   Patient reports that she is not currently home (does not have her medications with her), but is not at the Day Program due to the weather.   Reports that she did pick up and is restarting/starting her levothyroxine  and folic acid  prescriptions today. Reports also picked up an OTC Vitamin D  product, but is not sure of the strength and has not yet started taking. Requests that we follow up again on Wednesday, when she expects to be back at the Day Program and we can also speak with Microbiologist. Schedule as requested. Request patient to have her medications with her at the time of our call.  Sharyle Sia, PharmD, [facility] [phone]

## 2024-01-14 NOTE — Patient Instructions (Addendum)
 I look forward to speaking with you again by telephone on 01/16/2024 at 10:30 AM  Please have your medications with you for us  to review at the time of this phone call.  Thank you!  Sharyle Sia, PharmD, [facility] [phone]

## 2024-01-16 ENCOUNTER — Other Ambulatory Visit: Admitting: Pharmacist

## 2024-01-16 ENCOUNTER — Other Ambulatory Visit: Payer: Self-pay | Admitting: Pharmacist

## 2024-01-16 DIAGNOSIS — E559 Vitamin D deficiency, unspecified: Secondary | ICD-10-CM

## 2024-01-16 DIAGNOSIS — E890 Postprocedural hypothyroidism: Secondary | ICD-10-CM

## 2024-01-16 DIAGNOSIS — E538 Deficiency of other specified B group vitamins: Secondary | ICD-10-CM

## 2024-01-16 NOTE — Progress Notes (Unsigned)
° °  01/18/2024  Patient ID: Desiree Gaines, female   DOB: October 18, 1985, 38 y.o.   MRN: [id number]  Outreach to patient today to follow up regarding medication access/adherence   Today reach both patient and Peer Support Worker Nat Molt   Patient confirms restarted taking her levothyroxine  137 mcg each morning before breakfast.  Reports planning to start taking folic acid  1 mg, crushed in apple sauce, daily  Patient picked up OTC chewable Vitamin D  product, but is not sure of the strength, has not yet started taking and forgot to bring this with her today. - Patient plans to bring this to Day Program tomorrow and Peer Support Worker will take a picture so that we can confirm the strength. Requests that I call back on 01/18/2024  Nat reports that she has a weekly pillbox for patient and plans to help patient to fill her medications into this adherence tool each week.  Sharyle Sia, PharmD, [facility] [phone]

## 2024-01-18 ENCOUNTER — Other Ambulatory Visit: Payer: Self-pay | Admitting: Pharmacist

## 2024-01-18 DIAGNOSIS — E890 Postprocedural hypothyroidism: Secondary | ICD-10-CM

## 2024-01-18 DIAGNOSIS — E559 Vitamin D deficiency, unspecified: Secondary | ICD-10-CM

## 2024-01-18 DIAGNOSIS — E538 Deficiency of other specified B group vitamins: Secondary | ICD-10-CM

## 2024-01-18 NOTE — Patient Instructions (Addendum)
 I look forward to speaking with you again by telephone on 02/08/2024 at 11:30 AM    Please have your medications with you for us  to review at the time of this phone call.   Thank you!   Sharyle Sia, PharmD, [facility] [phone]

## 2024-01-18 NOTE — Progress Notes (Signed)
° °  01/18/2024  Patient ID: [name], female   DOB: [birth year], 38 y.o.   MRN: [id number]  Outreach to both patient and Peer Support Worker Nat Molt again today as requested.  Confirm patient taking levothyroxine  137 mcg each morning before breakfast and that this is now filled into weekly pillbox for her.   Reports also started taking folic acid  1 mg, crushed in apple sauce, daily.  Patient has brought in OTC product that she picked up from pharmacy last week and find that she purchased a Calcium (with Vitamin D ) product, rather than the product intended. She is not taking the calcium product.  Plan:   Peer Environmental Manager states that she will help patient with obtaining chewable Vitamin D  5,000 units and start taking this daily.   Peer support Worker states that she will help patient with filling pill box each week  Follow Up Plan: Clinical Pharmacist will follow up with patient by telephone on 02/08/2024 at 11:30 AM    Sharyle Sia, PharmD, [facility] [phone]

## 2024-02-08 ENCOUNTER — Other Ambulatory Visit: Payer: Self-pay | Admitting: Pharmacist

## 2024-02-08 DIAGNOSIS — E559 Vitamin D deficiency, unspecified: Secondary | ICD-10-CM

## 2024-02-08 NOTE — Patient Instructions (Addendum)
 I look forward to speaking with you again by telephone on 02/18/2024 at 1:30 PM    Please have your medications with you for us  to review at the time of this phone call.   Thank you!   Sharyle Sia, PharmD, [facility] [phone]

## 2024-02-08 NOTE — Progress Notes (Signed)
" ° °  02/08/2024 Name: MELEANE SELINGER MRN: [id number] DOB: [birth year]  Chief Complaint  Patient presents with   Medication Adherence   Outreach to patient again today as requested to follow up regarding medication adherence.  Patient reports that she is not at the Day Program today/not with Peer Support Worker Nat Molt recently as she has been home with her kids during the holidays/while they are out of school, but plans to return to the Day Program on Monday.   Confirms taking both levothyroxine  137 mcg each morning before breakfast as well as folic acid  1 mg daily from weekly pillbox as filled by Nat   Reports has not yet picked up chewable Vitamin D  5,000 units from Over the counter as was waiting on her new heath plan OTC benefit card to come in the mail, which she just received.   Plan:    Patient plans to collaborate with Peer Support Worker on Monday for help with obtaining chewable Vitamin D  5,000 units and start taking this daily.  - Patient asks that I follow up with her and Nat again in ~1 week   Follow Up Plan: Clinical Pharmacist will follow up with patient by telephone on 02/18/2024 at 1:30 PM    Sharyle Sia, PharmD, [facility] [phone]   "

## 2024-02-18 ENCOUNTER — Telehealth: Payer: Self-pay | Admitting: Pharmacist

## 2024-02-18 ENCOUNTER — Other Ambulatory Visit: Payer: Self-pay | Admitting: Pharmacist

## 2024-02-18 NOTE — Progress Notes (Unsigned)
" ° °  Outreach Note  02/18/2024 Name: BLESSEN KIMBROUGH MRN: [id number] DOB: [birth year]  Referred by: Lemon Raisin, MD  Was unable to reach patient via telephone today x 2 and have left HIPAA compliant voicemail asking patient to return my call.    Follow Up Plan: Will attempt to reach patient by telephone again within the next 7 days  Sharyle Sia, PharmD, [facility] [phone]    "

## 2024-02-26 ENCOUNTER — Ambulatory Visit (INDEPENDENT_AMBULATORY_CARE_PROVIDER_SITE_OTHER): Admitting: Student

## 2024-02-26 ENCOUNTER — Encounter: Payer: Self-pay | Admitting: Student

## 2024-02-26 ENCOUNTER — Other Ambulatory Visit: Payer: Self-pay | Admitting: Student

## 2024-02-26 VITALS — BP 122/80 | HR 95 | Ht 62.0 in | Wt 218.1 lb

## 2024-02-26 DIAGNOSIS — E890 Postprocedural hypothyroidism: Secondary | ICD-10-CM

## 2024-02-26 DIAGNOSIS — E538 Deficiency of other specified B group vitamins: Secondary | ICD-10-CM | POA: Diagnosis not present

## 2024-02-26 MED ORDER — LEVOTHYROXINE SODIUM 100 MCG/5ML PO SOLN: 137.0000 ug | Freq: Every day | ORAL | 5 refills | Status: DC

## 2024-02-26 NOTE — Progress Notes (Signed)
 "  Established Patient Office Visit  Subjective   Patient ID: Desiree Gaines, female    DOB: 12-Feb-1985  Age: 39 y.o. MRN: [id number]  Chief Complaint  Patient presents with   Hypothyroidism    Patient would like to take liquid medication for thyroid  and folic acid  medication, patient reports it is hard for her to swallow pills, has been nauseous     Desiree Gaines is a 39 y.o. person with medical hx listed below who presents today for follow up of hypothyroidism.   Patient Active Problem List   Diagnosis Date Noted   Vitamin D  deficiency 11/22/2023   B12 deficiency 02/15/2023   Folate deficiency 09/12/2022   Diastasis of rectus abdominis 10/27/2021   Chronic constipation 09/02/2020   Encounter for screening colonoscopy 09/02/2020   Ventral hernia without obstruction or gangrene 08/12/2020   Facial cellulitis 08/02/2020   Hypothyroidism 08/02/2020   Iron  deficiency anemia 03/06/2011   Developmental delay 06/19/2000      ROS Refer to HPI    Objective:     Outpatient Encounter Medications as of 02/26/2024  Medication Sig   Cholecalciferol  (VITAMIN D3) 125 MCG (5000 UT) CHEW Chew 1 tablet by mouth daily.   folic acid  (FOLVITE ) 1 MG tablet Take 1 tablet (1 mg total) by mouth daily.   hydrOXYzine  (ATARAX ) 25 MG tablet Take 1 tablet (25 mg total) by mouth every 8 (eight) hours as needed for itching (anxiety or insomnia).   levothyroxine  (SYNTHROID ) 137 MCG tablet TAKE 1 TABLET BY MOUTH DAILY BEFORE BREAKFAST.   Levothyroxine  Sodium 100 MCG/5ML SOLN Take 137 mcg by mouth daily.   medroxyPROGESTERone  (DEPO-PROVERA ) 150 MG/ML injection INJECT 1 ML (150 MG TOTAL) INTO THE MUSCLE EVERY 3 (THREE) MONTHS   [DISCONTINUED] levothyroxine  (SYNTHROID ) 137 MCG tablet Take 1 tablet (137 mcg total) by mouth daily before breakfast. (Patient not taking: Reported on 02/26/2024)   No facility-administered encounter medications on file as of 02/26/2024.    BP 122/80   Pulse 95   Ht 5'  2 (1.575 m)   Wt 218 lb 2 oz (98.9 kg)   SpO2 97%   BMI 39.90 kg/m  BP Readings from Last 3 Encounters:  02/26/24 122/80  01/07/24 (!) 137/91  11/26/23 122/82    Physical Exam Constitutional:      Appearance: Normal appearance.  HENT:     Mouth/Throat:     Mouth: Mucous membranes are moist.     Pharynx: Oropharynx is clear.  Neck:     Comments: No masses Cardiovascular:     Rate and Rhythm: Normal rate and regular rhythm.  Pulmonary:     Effort: Pulmonary effort is normal.     Breath sounds: No rhonchi or rales.  Abdominal:     General: Abdomen is flat. Bowel sounds are normal. There is no distension.     Palpations: Abdomen is soft.     Tenderness: There is no abdominal tenderness.  Musculoskeletal:        General: Normal range of motion.     Cervical back: No rigidity or tenderness.     Right lower leg: No edema.     Left lower leg: No edema.  Skin:    General: Skin is warm and dry.     Capillary Refill: Capillary refill takes less than 2 seconds.  Neurological:     General: No focal deficit present.     Mental Status: She is alert and oriented to person, place, and time.  Psychiatric:  Mood and Affect: Mood normal.        Behavior: Behavior normal.        08/01/2023   10:10 AM 05/21/2023   11:04 AM 01/18/2023    3:38 PM  Depression screen PHQ 2/9  Decreased Interest 0 0 0  Down, Depressed, Hopeless 0 0 0  PHQ - 2 Score 0 0 0  Altered sleeping 1 2 2   Tired, decreased energy 1 2 1   Change in appetite 0 0 0  Feeling bad or failure about yourself  0 0 0  Trouble concentrating 1 0 0  Moving slowly or fidgety/restless 0 1 0  Suicidal thoughts 0 0 0  PHQ-9 Score 3  5  3    Difficult doing work/chores Not difficult at all  Not difficult at all     Data saved with a previous flowsheet row definition       05/21/2023   11:04 AM 01/18/2023    3:38 PM 11/16/2022    3:41 PM 10/31/2022    3:52 PM  GAD 7 : Generalized Anxiety Score  Nervous, Anxious, on  Edge 3  2  2   0   Control/stop worrying 2  2  3   0   Worry too much - different things 3  2  2   0   Trouble relaxing 0  1  1  0   Restless 2  0  0  0   Easily annoyed or irritable 2  0  0  0   Afraid - awful might happen 0  0  0  0   Total GAD 7 Score 12 7 8  0  Anxiety Difficulty Not difficult at all Not difficult at all Not difficult at all Not difficult at all     Data saved with a previous flowsheet row definition    No results found for any visits on 02/26/24.  Last thyroid  functions Lab Results  Component Value Date   TSH 21.000 (H) 11/26/2023   T4TOTAL 10.3 11/16/2022   FREET4 0.93 11/26/2023   Last vitamin B12 and Folate Lab Results  Component Value Date   VITAMINB12 207 10/15/2023   FOLATE 3.5 (L) 10/15/2023      The ASCVD Risk score (Arnett DK, et al., 2019) failed to calculate for the following reasons:   The 2019 ASCVD risk score is only valid for ages 70 to 78    Assessment & Plan:  Postablative hypothyroidism Assessment & Plan: Has not taken levthyroxing for about 2 weeks due to getting the flu and unable to keep food down. Feeling better now and able to eat and drink normally. 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 Will send int liquid levothyroxine  as this may improve adherence. Thyroid  labs at next visit.    Folate deficiency Assessment & Plan: She will pick or liquid folate supplement OTC.    Other orders -     Levothyroxine  Sodium; Take 137 mcg by mouth daily.  Dispense: 300 mL; Refill: 5     Return in about 3 months (around 05/26/2024) for Thyroid , adrenal nodule.    Harlene Saddler, MD "

## 2024-02-26 NOTE — Patient Instructions (Addendum)
 Please pick up liquid folate at over the counter about 1 mg daily   I have order liquid levothyroxine  for you

## 2024-02-26 NOTE — Assessment & Plan Note (Signed)
 She will pick or liquid folate supplement OTC.

## 2024-02-26 NOTE — Telephone Encounter (Signed)
 Please complete PA.    KP

## 2024-02-26 NOTE — Assessment & Plan Note (Addendum)
 Has not taken levthyroxing for about 2 weeks due to getting the flu and unable to keep food down. Feeling better now and able to eat and drink normally. 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 Will send int liquid levothyroxine  as this may improve adherence. Thyroid  labs at next visit.

## 2024-02-26 NOTE — Telephone Encounter (Signed)
 Requested medications are due for refill today.  See note  Requested medications are on the active medications list.  yes  Last refill. 02/26/2024 5 rf  Future visit scheduled.   yes  Notes to clinic.  Pharmacy comment: Alternative Requested:INSURANCE DOSE NOT COVER AND MEDICATION IS $300 ON DISCOUNT CARD. ATTEMPT PRIOR AUTH OR CONSIDER RE-WRITING FOR THE TABLET, THANKS!     Requested Prescriptions  Pending Prescriptions Disp Refills   THYQUIDITY  100 MCG/5ML SOLN [Pharmacy Med Name: THYQUIDITY  100 MCG/5 ML SOLN] 300 mL 5    Sig: TAKE (6.85ML) DAILY     Endocrinology:  Hypothyroid Agents Failed - 02/26/2024  4:33 PM      Failed - TSH in normal range and within 360 days    TSH  Date Value Ref Range Status  11/26/2023 21.000 (H) 0.450 - 4.500 uIU/mL Final         Passed - Valid encounter within last 12 months    Recent Outpatient Visits           Today Postablative hypothyroidism   [facility], MD   3 months ago Sore throat   [place] [facility], MD   9 months ago Acquired hypothyroidism   [facility] at [facility]YSON, MD

## 2024-02-27 ENCOUNTER — Other Ambulatory Visit: Payer: Self-pay | Admitting: Student

## 2024-02-27 ENCOUNTER — Telehealth: Payer: Self-pay | Admitting: Pharmacy Technician

## 2024-02-27 ENCOUNTER — Other Ambulatory Visit (HOSPITAL_COMMUNITY): Payer: Self-pay

## 2024-02-27 MED ORDER — LEVOTHYROXINE SODIUM 137 MCG PO TABS: 137.0000 ug | ORAL_TABLET | Freq: Every day | ORAL | 0 refills | Status: AC

## 2024-02-27 NOTE — Telephone Encounter (Signed)
 Please review.  KP

## 2024-02-27 NOTE — Telephone Encounter (Signed)
 PA request has been Received. New Encounter has been or will be created for follow up. For additional info see Pharmacy Prior Auth telephone encounter from 02/27/2024.

## 2024-02-27 NOTE — Telephone Encounter (Signed)
 Pharmacy Patient Advocate Encounter   Received notification from RX Request Messages that prior authorization for Thyquidity  100MCG/5ML Soln (Levothyroxine  Sodium) is required/requested.   Insurance verification completed.   The patient is insured through [facility].   Per test claim: Per test claim, medication is not covered due to plan/benefit exclusion, PA not submitted at this time    **levothyroxine  tablets can generally be crushed and mixed with a small amount of water (5-10 mL) or sprinkled on applesauce for those who have trouble swallowing**

## 2024-03-12 ENCOUNTER — Other Ambulatory Visit: Payer: Self-pay | Admitting: Pharmacist

## 2024-03-12 ENCOUNTER — Ambulatory Visit

## 2024-03-12 DIAGNOSIS — E559 Vitamin D deficiency, unspecified: Secondary | ICD-10-CM

## 2024-03-12 DIAGNOSIS — E538 Deficiency of other specified B group vitamins: Secondary | ICD-10-CM

## 2024-03-12 DIAGNOSIS — E890 Postprocedural hypothyroidism: Secondary | ICD-10-CM

## 2024-03-12 NOTE — Patient Instructions (Signed)
 I look forward to speaking with you again by telephone on 03/19/2024 at 11:30 AM    Please have your medications with you for us  to review at the time of this phone call.   Thank you!   Sharyle Sia, PharmD, [facility] [phone]

## 2024-03-12 NOTE — Progress Notes (Signed)
" ° ° ° °  03/12/2024 Name: Desiree Gaines MRN: [id number] DOB: 1985/12/21  Outreach to patient again today as requested to follow up regarding medication adherence.   Patient reports that she is not at the Day Program today/not with Peer Support Worker Nat Molt as Day Program is closed due to recent weather.   From review of chart, note patient seen for Office Visit with PCP on 02/26/2024 and at this appointment patient reported having not taken her levothyroxine  for ~2 weeks due to getting the flu and unable to keep food down. Provider sent prescription for liquid form of levothyroxine  to pharmacy for patient. However, liquid form of levothyroxine  was not covered through patient's insurance.  Today patient states that she is aware of liquid form not being covered and plans to restart taking levothyroxine  137 mcg each morning before breakfast as well as folic acid  1 mg daily.   Provide counseling to patient that levothyroxine  may be crushed and suspended in 5 to 10 mL of water (suspension should be used immediately) per manufacturer labeling, but patient says that she instead prefers to continue to take by chewing tablet daily as this has worked for her in the past. - Recommend to restart taking both levothyroxine  and folic acid  daily as directed.    Reports has not yet picked up chewable Vitamin D  5,000 units from over the counter.   Plan:    Patient plans to collaborate with Peer Support Worker when she returns to Day Program for help with obtaining chewable Vitamin D  5,000 units and start taking this daily.  - Patient asks that I follow up with her and Nat again in ~1 week   Follow Up Plan: Clinical Pharmacist will follow up with patient by telephone on 03/19/2024 at 11:30 AM    Sharyle Sia, PharmD, [facility] [phone] "

## 2024-03-14 NOTE — Therapy (Incomplete)
 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   Thyroid  cancer (HCC)    Thyroid  disease    Umbilical hernia    Past Surgical History:  Procedure Laterality Date   CESAREAN SECTION  06/2011   x 2   COLONOSCOPY WITH PROPOFOL  N/A 07/18/2021   Procedure: COLONOSCOPY WITH PROPOFOL ;  Surgeon: Therisa Bi, MD;  Location: [facility] ENDOSCOPY;  Service: Gastroenterology;  Laterality: N/A;   tubes in ears Bilateral    Patient Active Problem List   Diagnosis Date Noted   Vitamin D  deficiency 11/22/2023   B12 deficiency 02/15/2023   Folate deficiency 09/12/2022   Diastasis of rectus abdominis 10/27/2021   Chronic constipation 09/02/2020   Encounter for screening colonoscopy 09/02/2020   Ventral hernia without obstruction or gangrene 08/12/2020   Facial cellulitis 08/02/2020   Hypothyroidism 08/02/2020   Iron  deficiency anemia 03/06/2011   Developmental delay 06/19/2000    PCP: Lemon Raisin, MD  REFERRING PROVIDER: Delinda Jinnie Jansky, CNM  REFERRING DIAG: M62.08 (ICD-10-CM) - Diastasis recti  THERAPY DIAG:  No diagnosis found.  Rationale for Evaluation and Treatment: Rehabilitation  ONSET DATE: ***  SUBJECTIVE:                                                                                                                                                                                           SUBJECTIVE STATEMENT: ***  FUNCTIONAL LIMITATIONS: ***  PERTINENT HISTORY:   Pt is a *** yo female who presents with   Pt with distended abdomen and tenderness at the top of her abdomen, pt reports she had imaging in  the past and was told nothing is wrong   Problem List:  *** *** ***  Medications for current condition: *** Surgeries: *** Sexual abuse: {Yes/No:304960894}  PAIN:  Are you having pain? {yes/no:20286} NPRS scale: Current ***/10; Best ***/10; Worst ***/10 Pain location: {pelvic pain location:27098}  Pain type: {type:313116} Pain description: {PAIN DESCRIPTION:21022940}   Aggravating factors: *** Relieving factors: ***   BOWEL MOVEMENT: Pain with bowel movement: {yes/no:20286} Constipation: *** Type of bowel movement:{PT BM type:27100::Type (Bristol Stool Scale) ***,Frequency ***,Strain ***} Fully empty rectum: {No/Yes:304960894} Leakage: {Yes/No:304960894}  Bowel urgency: *** Fiber supplement/laxative {YES/NO AS:20300}  URINATION: Leakage: {PT leakage:27103} Urgency: {YES/NO AS:20300} Frequency:during the day ***                                                       Nocturia: {Yes/No:304960894}***   Fully empty bladder: {Yes/No:304960894}***                             Post-void dribble: {YES/NO AS:20300} Stream: {PT urination:27102} Pads/briefs: {Yes/No:304960894}  Fluid intake: ***  INTERCOURSE:  Ability to have vaginal penetration {YES/NO:21197} Pain with intercourse: {pain with intercourse PA:27099} Dryness: {YES/NO AS:20300} Climax: *** Lubricant:  PREGNANCY: Vaginal deliveries *** Tearing {Yes***/No:304960894} Episiotomy {YES/NO AS:20300} C-section deliveries *** Currently pregnant {Yes***/No:304960894}  PROLAPSE: {PT prolapse:27101}   PRECAUTIONS: {Therapy precautions:24002}  RED FLAGS: {PT Red Flags:29287}   WEIGHT BEARING RESTRICTIONS: {Yes ***/No:24003}  FALLS:  Has patient fallen in last 6 months? {fallsyesno:27318}  OCCUPATION: ***  ACTIVITY LEVEL : ***  PLOF: {PLOF:24004}  PATIENT GOALS: ***   OBJECTIVE:  Note: Objective measures were completed at Evaluation unless  otherwise noted.  DIAGNOSTIC FINDINGS:   ***  PATIENT SURVEYS:   PFIQ-7: ***    SENSATION: Light touch: {intact/deficits:24005}  LUMBAR SPECIAL TESTS:  {lumbar special test:25242}  FUNCTIONAL TESTS:  {Functional tests:24029} Single leg stance:  Rt:  Lt: Squat: Bed mobility:  GAIT: Assistive device utilized: {Assistive devices:23999} Comments: ***  POSTURE: {posture:25561} lower abdominal dumping, upper abdominal gripping, forward head, overactive cervical Musculature, iliac crest, shoulder asymetry,  Breathing Assessment: breaths into shoulders and upper chest, early and over activation of cervical accessory muscles, paradoxical pattern of breathing, decreased expansion into diaphragm  ISA/Costal Mobility: limited, increased ISA bilaterally.  Abdominal: *** Abdominal Activation: *** EO substitution with attempted TrA activation in supine Abdominal Gripping/Dumping: upper abdominal gripping lower abdominal dumping. Diastasis: {Yes/No:304960894}*** Scar tissue: {Yes/No:304960894}*** Active Straight Leg Raise: ***   LUMBARAROM/PROM:  A/PROM A/PROM  Eval (% available)  Flexion   Extension   Right lateral flexion   Left lateral flexion   Right rotation   Left rotation    (Blank rows = not tested)  PALPATION:  General: ***  Pelvic Alignment: ***                External Perineal Exam: ***               Patient confirms identification and approves PT to assess internal pelvic floor and treatment {yes/no:20286} All internal or external pelvic floor assessments and/or treatments are completed with proper hand hygiene and gloves hands. If needed gloves are changed with hand hygiene during patient care time.  PELVIC Internal:    eval  Vaginal MMT:   PFM ROM: good descent with bulging, good lift with kegel  Perineal Movement with Cough: does not have knack with cough  Tenderness to Palpation:  Muscle and clock, referral?  L Pubococcygeus:  L  Iliococcygeus:  L Coccygeus:  L Obturator Internus:  R Pubococcygeus:  R Iliococcygeus:  R Coccygeus:  R Obturator Internus:      Scar tissue:           TONE: ***  PROLAPSE: ***  LOWER EXTREMITY ROM:  {AROM/PROM:27142} ROM Right eval Left eval  Hip flexion    Hip extension    Hip abduction  Hip internal rotation    Hip external rotation    Knee flexion    Knee extension    Ankle dorsiflexion    Ankle plantarflexion    Ankle inversion    Ankle eversion     (Blank rows = not tested)  LOWER EXTREMITY MMT:  MMT Right eval Left eval  Hip flexion /5 /5  Hip extension /5 /5  Hip abduction /5 /5  Hip adduction /5 /5  Hip internal rotation /5 /5  Hip external rotation /5 /5  Knee flexion /5 /5  Knee extension /5 /5  Ankle dorsiflexion /5 /5  Ankle plantarflexion /5 /5  Ankle inversion /5 /5  Ankle eversion /5 /5   (Blank rows = not tested)  TODAY'S TREATMENT:                                                                                                                              DATE: ***  EVAL completed   PATIENT EDUCATION:  Education details: *** Person educated: {Person educated:25204::Patient} Education method: {Education Method:25205::Explanation,Demonstration,Tactile cues,Verbal cues,Handouts} Education comprehension: {Education Comprehension:25206::verbalized understanding}  HOME EXERCISE PROGRAM: ***  ASSESSMENT:  CLINICAL IMPRESSION: Patient is a *** y.o. female who presents with   Pt's musculoskeletal assessment revealed uneven pelvic girdle and shoulder height, asymmetries to gait pattern, limited spinal /pelvic mobility with L thoracic rotation affecting reciprocal arm swing and affecting costal and diaphragmatic biomechanics, poor body mechanics and postural deficits which places strain on the abdominal/pelvic floor mm. These are deficits that indicate an ineffective intraabdominal pressure system associated with  increased risk for pt's symptoms. Pt will benefit from propioception/ coordination/ body mechanics training and education with gravity-loaded tasks at work and home and  fitness modifications in order to gain a more effective intraabdominal pressure system to minimize Sx. Regional interdependent approaches including thoracic mobility, costal expansion and coordination of functional slings throughout the body utilized to yield greater benefits in pt's POC due to the complexity of pt's medical Hx and the significant impact their Sx have had on their QOL. Pt would benefit from a biopsychosocial approach to yield optimal outcomes. Plan to build interdisciplinary team with pt's providers to optimize patient-centered care as needed.    Pt will benefit from skilled PT in order to address pain, pressure management deficits and weakness and incoordination in pelvic girdle, abdominal musculature and postural muscles.   Pt benefits from skilled PT.   OBJECTIVE IMPAIRMENTS: {opptimpairments:25111}.   ACTIVITY LIMITATIONS: {activitylimitations:27494}  PARTICIPATION LIMITATIONS: {participationrestrictions:25113}  PERSONAL FACTORS: {Personal factors:25162} are also affecting patient's functional outcome.   REHAB POTENTIAL: {rehabpotential:25112}  CLINICAL DECISION MAKING: {clinical decision making:25114}  EVALUATION COMPLEXITY: {Evaluation complexity:25115}   GOALS: Goals reviewed with patient? Yes  SHORT TERM GOALS: Target date: ***   Pt will be consistent and independent with HEP to promote ability to perform daily activities with reduced symptoms. Baseline: Not IND  Goal status: INITIAL   LONG TERM GOALS: Target date: ***  1.Pt will demo proper deep core coordination without chest breathing and optimal excursion of diaphragm/pelvic floor in order to promote spinal stability and pelvic floor function  Baseline: dyscoordination Goal status: INITIAL  2.  Pt will demo proper body mechanics in  against gravity tasks and ADLs  work tasks, fitness  to minimize straining pelvic floor / back    Baseline: not IND, improper form that places strain on pelvic floor  Goal status: INITIAL  3.  Baseline:  Goal status: INITIAL  4.  Baseline:  Goal status: INITIAL  5. Pt will improve PFDI-7 questionnaire to  pts  score change  to demo improved QOL  Baseline:    ( greater pts indicate greater negative impact on QOL)     pts  ( total)    pts  ( UIQ-7 )    pts  ( CRAIQ-7 )    pts  ( POPIQ-7 )  Baseline:  Goal status: INITIAL  6.   PLAN:  PT FREQUENCY: {rehab frequency:25116::1x/week}  PT DURATION: {rehab duration:25117::12 weeks}  PLANNED INTERVENTIONS: {rehab planned interventions:25118::97110-Therapeutic exercises,97530- Therapeutic [phone]- Neuromuscular re-education,97535- Self Rjmz,02859- Manual therapy,[id number]- Electrical stimulation (unattended),97032- Electrical stimulation (manual),20560 (1-2 muscles), 20561 (3+ muscles)- Dry Needling,Patient/Family education,Balance training,Taping,Joint mobilization,Scar mobilization,Cryotherapy,Moist heat,Biofeedback}   PLAN FOR NEXT SESSION: ***   Donna Porter, PT 03/14/2024, 10:50 AM  "

## 2024-03-18 ENCOUNTER — Ambulatory Visit

## 2024-03-19 ENCOUNTER — Other Ambulatory Visit

## 2024-03-25 ENCOUNTER — Ambulatory Visit

## 2024-03-28 ENCOUNTER — Ambulatory Visit

## 2024-04-01 ENCOUNTER — Ambulatory Visit

## 2024-04-08 ENCOUNTER — Ambulatory Visit

## 2024-04-15 ENCOUNTER — Ambulatory Visit

## 2024-04-22 ENCOUNTER — Ambulatory Visit

## 2024-04-29 ENCOUNTER — Ambulatory Visit

## 2024-06-04 ENCOUNTER — Ambulatory Visit: Admitting: Student

## 2024-08-06 ENCOUNTER — Ambulatory Visit
